# Patient Record
Sex: Female | Born: 1945 | Race: White | Hispanic: No | Marital: Married
Health system: Southern US, Community
[De-identification: ages and names within clinical notes are randomized; demographics above are authoritative.]

## PROBLEM LIST (undated history)

## (undated) DIAGNOSIS — E079 Disorder of thyroid, unspecified: Secondary | ICD-10-CM

## (undated) DIAGNOSIS — E785 Hyperlipidemia, unspecified: Secondary | ICD-10-CM

## (undated) DIAGNOSIS — K219 Gastro-esophageal reflux disease without esophagitis: Secondary | ICD-10-CM

## (undated) HISTORY — PX: BREAST BIOPSY: SHX20

## (undated) HISTORY — PX: BREAST SURGERY: SHX581

## (undated) HISTORY — PX: OTHER SURGICAL HISTORY: SHX169

## (undated) HISTORY — DX: Gastro-esophageal reflux disease without esophagitis: K21.9

## (undated) HISTORY — PX: THYROIDECTOMY, PARTIAL: SHX18

## (undated) HISTORY — PX: ABDOMINAL HYSTERECTOMY: SHX81

## (undated) HISTORY — DX: Hyperlipidemia, unspecified: E78.5

## (undated) HISTORY — DX: Disorder of thyroid, unspecified: E07.9

---

## 2004-10-02 ENCOUNTER — Ambulatory Visit: Payer: Self-pay | Admitting: Unknown Physician Specialty

## 2005-12-16 DIAGNOSIS — K639 Disease of intestine, unspecified: Secondary | ICD-10-CM | POA: Insufficient documentation

## 2005-12-16 DIAGNOSIS — E890 Postprocedural hypothyroidism: Secondary | ICD-10-CM | POA: Insufficient documentation

## 2005-12-16 DIAGNOSIS — K219 Gastro-esophageal reflux disease without esophagitis: Secondary | ICD-10-CM | POA: Insufficient documentation

## 2005-12-23 ENCOUNTER — Ambulatory Visit: Payer: Self-pay | Admitting: Family Medicine

## 2007-01-25 ENCOUNTER — Ambulatory Visit: Payer: Self-pay | Admitting: Family Medicine

## 2007-06-23 DIAGNOSIS — R945 Abnormal results of liver function studies: Secondary | ICD-10-CM | POA: Insufficient documentation

## 2007-06-24 DIAGNOSIS — R7309 Other abnormal glucose: Secondary | ICD-10-CM | POA: Insufficient documentation

## 2008-02-22 ENCOUNTER — Ambulatory Visit: Payer: Self-pay | Admitting: Family Medicine

## 2009-02-25 ENCOUNTER — Ambulatory Visit: Payer: Self-pay | Admitting: Family Medicine

## 2009-09-04 ENCOUNTER — Ambulatory Visit: Payer: Self-pay | Admitting: Gastroenterology

## 2009-09-04 LAB — HM COLONOSCOPY

## 2009-10-02 DIAGNOSIS — E039 Hypothyroidism, unspecified: Secondary | ICD-10-CM | POA: Insufficient documentation

## 2009-10-02 DIAGNOSIS — E559 Vitamin D deficiency, unspecified: Secondary | ICD-10-CM | POA: Insufficient documentation

## 2010-07-20 ENCOUNTER — Ambulatory Visit: Payer: Self-pay | Admitting: Family Medicine

## 2013-09-27 ENCOUNTER — Ambulatory Visit: Payer: Self-pay | Admitting: Family Medicine

## 2014-08-19 DIAGNOSIS — N816 Rectocele: Secondary | ICD-10-CM | POA: Insufficient documentation

## 2014-08-19 DIAGNOSIS — Z1239 Encounter for other screening for malignant neoplasm of breast: Secondary | ICD-10-CM | POA: Insufficient documentation

## 2014-08-19 DIAGNOSIS — N63 Unspecified lump in unspecified breast: Secondary | ICD-10-CM | POA: Insufficient documentation

## 2014-08-19 DIAGNOSIS — Z86010 Personal history of colonic polyps: Secondary | ICD-10-CM | POA: Insufficient documentation

## 2014-08-19 DIAGNOSIS — E7800 Pure hypercholesterolemia, unspecified: Secondary | ICD-10-CM | POA: Insufficient documentation

## 2014-08-27 ENCOUNTER — Ambulatory Visit (INDEPENDENT_AMBULATORY_CARE_PROVIDER_SITE_OTHER): Payer: Medicare PPO | Admitting: Family Medicine

## 2014-08-27 ENCOUNTER — Encounter: Payer: Self-pay | Admitting: Family Medicine

## 2014-08-27 VITALS — BP 110/68 | HR 76 | Temp 98.1°F | Resp 16 | Ht 64.0 in | Wt 135.0 lb

## 2014-08-27 DIAGNOSIS — R945 Abnormal results of liver function studies: Secondary | ICD-10-CM

## 2014-08-27 DIAGNOSIS — E038 Other specified hypothyroidism: Secondary | ICD-10-CM

## 2014-08-27 DIAGNOSIS — R413 Other amnesia: Secondary | ICD-10-CM | POA: Diagnosis not present

## 2014-08-27 DIAGNOSIS — K219 Gastro-esophageal reflux disease without esophagitis: Secondary | ICD-10-CM

## 2014-08-27 DIAGNOSIS — R7989 Other specified abnormal findings of blood chemistry: Secondary | ICD-10-CM | POA: Diagnosis not present

## 2014-08-27 DIAGNOSIS — R7309 Other abnormal glucose: Secondary | ICD-10-CM | POA: Diagnosis not present

## 2014-08-27 MED ORDER — OMEPRAZOLE 20 MG PO CPDR: 20.0000 mg | DELAYED_RELEASE_CAPSULE | Freq: Every day | ORAL | Status: DC

## 2014-08-27 NOTE — Progress Notes (Signed)
Subjective:    Patient ID: [name], female    DOB: 08-15-1945, 69 y.o.   MRN: [id number]  HPI  Memory problem. Patient reports that she has been having memory problems for about 6 months now. Patient reports that her symptoms are worsening. Patient reports that her children have also noted that her memory is worsening.  Does have a very stressful home situation.  Does have 2 granddaughters, a boyfriend and 3 grandchildren living with her right now.  She provides meals, laundry, etc.  One of them works.  One boyfriend in prison, other boyfriend does have a job.   Also has lost almost 10 pounds.       Review of Systems  Constitutional: Positive for fatigue.  Neurological: Negative for dizziness, tremors, speech difficulty, weakness, numbness and headaches.  Psychiatric/Behavioral: Positive for decreased concentration. Negative for hallucinations, behavioral problems, confusion and agitation. The patient is nervous/anxious.     Patient Active Problem List   Diagnosis Date Noted  . Screening breast examination 08/19/2014  . Breast lump 08/19/2014  . Hypercholesteremia 08/19/2014  . History of colon polyps 08/19/2014  . 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 Adult hypothyroidism 10/02/2009  . Avitaminosis D 10/02/2009  . Abnormal blood sugar 06/24/2007  . Abnormal LFTs 06/23/2007  . Acid reflux 12/16/2005  . Bowel disease 12/16/2005  . Hypothyroidism, postop 12/16/2005   Past Medical History  Diagnosis Date  . Thyroid disease   . Hyperlipidemia   . GERD (gastroesophageal reflux disease)    Current Outpatient Prescriptions on File Prior to Visit  Medication Sig  . levothyroxine (SYNTHROID) 100 MCG tablet Take by mouth.  . MULTIPLE VITAMIN PO   . omeprazole (PRILOSEC) 20 MG capsule Take by mouth.  . solifenacin (VESICARE) 10 MG tablet Take by mouth.  Marland Kitchen VITAMIN D, ERGOCALCIFEROL, PO Take 2,000 Int'l Units by mouth daily.    No current  facility-administered medications on file prior to visit.   Not on File Past Surgical History  Procedure Laterality Date  . Thyroidectomy, partial  1990's  . Abdominal hysterectomy      partial  . Breast surgery  2000's    biopsy   History   Social History  . Marital Status: Married    Spouse Name: N/A  . Number of Children: N/A  . Years of Education: N/A   Occupational History  . Not on file.   Social History Main Topics  . Smoking status: Never Smoker   . Smokeless tobacco: Never Used  . Alcohol Use: No  . Drug Use: No  . Sexual Activity: Not on file   Other Topics Concern  . Not on file   Social History Narrative   Family History  Problem Relation Age of Onset  . Alzheimer's disease Mother   . Diabetes Mother     ?  Marland Kitchen Alcohol abuse Father   . Cancer Father     unknown cancer  . Diabetes Other   . Heart disease Other   . Cancer Other   . Colon polyps Other      Cognitive Testing - 6-CIT  Correct? Score   What year is it? yes 0 0 or 4  What month is it? yes 0 0 or 3  Memorize:    [name],  43,  [address]  [place],      What time is it? (within 1 hour) yes 0 0 or 3  Count backwards from 20 yes 0 0, 2,  or 4  Name the months of the year yes 0 0, 2, or 4  Repeat name & address above yes 0 0, 2, 4, 6, 8, or 10       TOTAL SCORE  0/28   Interpretation:  Normal  Normal (0-7) Abnormal (8-28)       Objective:   Physical Exam  Constitutional: She is oriented to person, place, and time. She appears well-developed and well-nourished.  Neurological: She is alert and oriented to person, place, and time.  Psychiatric: She has a normal mood and affect. Her behavior is normal. Judgment and thought content normal.    Blood pressure 110/68, pulse 76, temperature 98.1 F (36.7 C), temperature source Oral, resp. rate 16, height 5\' 4"  (1.626 m), weight 135 lb (61.236 kg).       Assessment & Plan:   1. Abnormal LFTs Will check labs.   - Comprehensive  metabolic panel  2. Abnormal blood sugar Will check labs.  Has lost weight, but is not concerned. Has been eating less with all the stress at home. - Hemoglobin A1c  3. Other specified hypothyroidism Will check labs.  - TSH  4. Memory loss Will check labs and refer to Neurology to evaluate and treat.  - Vitamin B12 - CBC with Differential/Platelet - Ambulatory referral to Neurology  5. Gastroesophageal reflux disease without esophagitis Restart medication as symptoms have returned.   - omeprazole (PRILOSEC) 20 MG capsule; Take 1 capsule (20 mg total) by mouth daily.  Dispense:  90 capsule; Refill: 3  Do suspect some symptoms are related to stress patient is under.    Lorie PhenixNancy Bear Osten, MD

## 2014-08-28 DIAGNOSIS — E038 Other specified hypothyroidism: Secondary | ICD-10-CM | POA: Diagnosis not present

## 2014-08-28 DIAGNOSIS — R413 Other amnesia: Secondary | ICD-10-CM | POA: Diagnosis not present

## 2014-08-28 DIAGNOSIS — R7309 Other abnormal glucose: Secondary | ICD-10-CM | POA: Diagnosis not present

## 2014-08-28 DIAGNOSIS — R7989 Other specified abnormal findings of blood chemistry: Secondary | ICD-10-CM | POA: Diagnosis not present

## 2014-08-29 ENCOUNTER — Telehealth: Payer: Self-pay

## 2014-08-29 LAB — CBC WITH DIFFERENTIAL/PLATELET
Basophils Absolute: 0 10*3/uL (ref 0.0–0.2)
Basos: 0 %
EOS (ABSOLUTE): 0.1 10*3/uL (ref 0.0–0.4)
Eos: 2 %
Hematocrit: 41.6 % (ref 34.0–46.6)
Hemoglobin: 14.2 g/dL (ref 11.1–15.9)
Immature Grans (Abs): 0 10*3/uL (ref 0.0–0.1)
Immature Granulocytes: 0 %
Lymphocytes Absolute: 1.2 10*3/uL (ref 0.7–3.1)
Lymphs: 19 %
MCH: 32 pg (ref 26.6–33.0)
MCHC: 34.1 g/dL (ref 31.5–35.7)
MCV: 94 fL (ref 79–97)
Monocytes Absolute: 0.5 10*3/uL (ref 0.1–0.9)
Monocytes: 9 %
Neutrophils Absolute: 4.4 10*3/uL (ref 1.4–7.0)
Neutrophils: 70 %
Platelets: 173 10*3/uL (ref 150–379)
RBC: 4.44 x10E6/uL (ref 3.77–5.28)
RDW: 13.1 % (ref 12.3–15.4)
WBC: 6.3 10*3/uL (ref 3.4–10.8)

## 2014-08-29 LAB — VITAMIN B12: Vitamin B-12: 321 pg/mL (ref 211–946)

## 2014-08-29 LAB — HEMOGLOBIN A1C
Est. average glucose Bld gHb Est-mCnc: 117 mg/dL
Hgb A1c MFr Bld: 5.7 % — ABNORMAL HIGH (ref 4.8–5.6)

## 2014-08-29 LAB — TSH: TSH: 3.35 u[IU]/mL (ref 0.450–4.500)

## 2014-08-29 LAB — COMPREHENSIVE METABOLIC PANEL
ALT: 12 IU/L (ref 0–32)
AST: 13 IU/L (ref 0–40)
Albumin/Globulin Ratio: 1.8 (ref 1.1–2.5)
Albumin: 4.5 g/dL (ref 3.6–4.8)
Alkaline Phosphatase: 63 IU/L (ref 39–117)
BUN/Creatinine Ratio: 13 (ref 11–26)
BUN: 10 mg/dL (ref 8–27)
Bilirubin Total: 0.4 mg/dL (ref 0.0–1.2)
CO2: 24 mmol/L (ref 18–29)
Calcium: 9.3 mg/dL (ref 8.7–10.3)
Chloride: 104 mmol/L (ref 97–108)
Creatinine, Ser: 0.79 mg/dL (ref 0.57–1.00)
GFR calc Af Amer: 89 mL/min/{1.73_m2} (ref >59)
GFR calc non Af Amer: 77 mL/min/{1.73_m2} (ref >59)
Globulin, Total: 2.5 g/dL (ref 1.5–4.5)
Glucose: 107 mg/dL — ABNORMAL HIGH (ref 65–99)
Potassium: 4.2 mmol/L (ref 3.5–5.2)
Sodium: 142 mmol/L (ref 134–144)
Total Protein: 7 g/dL (ref 6.0–8.5)

## 2014-08-29 NOTE — Telephone Encounter (Signed)
-----   Message from Lorie PhenixNancy Maloney, MD sent at 08/29/2014 10:52 AM EDT ----- Labs ok. Vit B12 low normal. Would recommend take 1000 mcg otc daily and proceed with neurology referral. Thanks.

## 2014-08-29 NOTE — Telephone Encounter (Signed)
LMTCB 08/29/2014  Thanks,   -Laura  

## 2014-08-30 NOTE — Telephone Encounter (Signed)
LMTCB 08/30/2014  Thanks,   -Zachary Nole  

## 2014-09-02 NOTE — Telephone Encounter (Signed)
Patient advised as below.  

## 2014-09-03 ENCOUNTER — Other Ambulatory Visit: Payer: Self-pay | Admitting: Family Medicine

## 2014-09-03 DIAGNOSIS — E890 Postprocedural hypothyroidism: Secondary | ICD-10-CM

## 2014-09-03 NOTE — Telephone Encounter (Signed)
Pt called needing refill on her thyroid medication.  She uses [facility] in [place].   She is completely out.  Her call back is [phone].

## 2014-10-04 DIAGNOSIS — G3184 Mild cognitive impairment, so stated: Secondary | ICD-10-CM | POA: Diagnosis not present

## 2014-11-21 ENCOUNTER — Ambulatory Visit (INDEPENDENT_AMBULATORY_CARE_PROVIDER_SITE_OTHER): Payer: Medicare PPO | Admitting: Family Medicine

## 2014-11-21 ENCOUNTER — Encounter: Payer: Self-pay | Admitting: Family Medicine

## 2014-11-21 VITALS — BP 138/82 | HR 64 | Temp 97.8°F | Resp 16 | Ht 63.75 in | Wt 131.0 lb

## 2014-11-21 DIAGNOSIS — R634 Abnormal weight loss: Secondary | ICD-10-CM | POA: Diagnosis not present

## 2014-11-21 DIAGNOSIS — Z0000 Encounter for general adult medical examination without abnormal findings: Secondary | ICD-10-CM | POA: Diagnosis not present

## 2014-11-21 DIAGNOSIS — E038 Other specified hypothyroidism: Secondary | ICD-10-CM

## 2014-11-21 DIAGNOSIS — Z13820 Encounter for screening for osteoporosis: Secondary | ICD-10-CM | POA: Diagnosis not present

## 2014-11-21 DIAGNOSIS — Z1231 Encounter for screening mammogram for malignant neoplasm of breast: Secondary | ICD-10-CM

## 2014-11-21 DIAGNOSIS — Z23 Encounter for immunization: Secondary | ICD-10-CM | POA: Diagnosis not present

## 2014-11-21 NOTE — Progress Notes (Signed)
Patient: Katelyn Baker, Female    DOB: May 30, 1945, 69 y.o.   MRN: [id number] Visit Date: 11/21/2014  Today's Provider: Lorie Phenix, MD   Chief Complaint  Patient presents with  . Medicare Wellness  . Hypothyroidism   Subjective:    Annual wellness visit Katelyn Baker is a 69 y.o. female. She feels well. She reports exercising none due to grandchildren living with her. She reports she is sleeping well.  Last CPE- 09/21/2013 Last Pap- 2009 WNL; S/P hysterectomy Last Mammo- 10/07/2013- BI-RADS 2 Last Colonoscopy- 09/04/2009. Dr. Niel Baker. Diverticulosis. Otherwise WNL. Last BMD- 07/22/2010. WNL Tdap- 12/16/2005 Pneumovax- 12/02/2011 Prevnar- 09/30/2013 Zoster- 09/30/2013 -----------------------------------------------------------  Hypothyroidism: Patient presents for evaluation of thyroid function. Symptoms consist of fatigue, weight loss. Symptoms have present for several years. The symptoms are mild.  The problem has been unchanged.  Previous thyroid studies include TSH. The hypothyroidism is due to thyroidectomy.  Patient also with continued weight loss.  Has really lost a lot of weight over past years. Thinks related to stress and responsibility. Also has memory loss. No other symptoms like fever, night sweats, cough or bowel changes.     Review of Systems  Constitutional: Positive for fatigue and unexpected weight change (went from size to 16 to size 9 over the course of 1 year). Negative for fever, chills, diaphoresis, activity change and appetite change.  HENT: Negative.   Eyes: Negative.   Respiratory: Negative.   Cardiovascular: Negative.   Gastrointestinal: Negative.   Endocrine: Negative.   Genitourinary: Negative.   Musculoskeletal: Negative.   Skin: Negative.   Allergic/Immunologic: Negative.   Neurological: Negative.   Hematological: Negative.   Psychiatric/Behavioral: Negative.     Social History   Social History  . Marital Status: Married     Spouse Name: Katelyn Baker  . Number of Children: 2  . Years of Education: 13   Occupational History  . Retired    Social History Main Topics  . Smoking status: Never Smoker   . Smokeless tobacco: Never Used  . Alcohol Use: No  . Drug Use: No  . Sexual Activity: Not on file   Other Topics Concern  . Not on file   Social History Narrative    Patient Active Problem List   Diagnosis Date Noted  . Memory loss 08/27/2014  . Screening breast examination 08/19/2014  . Breast lump 08/19/2014  . Hypercholesteremia 08/19/2014  . History of colon polyps 08/19/2014  . 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 Adult hypothyroidism 10/02/2009  . Avitaminosis D 10/02/2009  . Abnormal blood sugar 06/24/2007  . Abnormal LFTs 06/23/2007  . Acid reflux 12/16/2005  . Bowel disease 12/16/2005  . Hypothyroidism, postop 12/16/2005    Past Surgical History  Procedure Laterality Date  . Thyroidectomy, partial  1990's  . Abdominal hysterectomy      partial  . Breast surgery  2000's    biopsy    Her family history includes Alcohol abuse in her father; Alzheimer's disease in her mother; Cancer in her father and other; Colon polyps in her other; Diabetes in her mother and other; Heart disease in her other.    Previous Medications   CALCIUM CARBONATE (CALCIUM 600 PO)    Take by mouth.   CYANOCOBALAMIN (RA VITAMIN B-12 TR) 1000 MCG TBCR    Take by mouth.   LEVOTHYROXINE (SYNTHROID, LEVOTHROID) 100 MCG TABLET    TAKE ONE TABLET BY MOUTH ONCE DAILY   OMEPRAZOLE (PRILOSEC) 20 MG CAPSULE  Take 1 capsule (20 mg total) by mouth daily.   SOLIFENACIN (VESICARE) 10 MG TABLET    Take by mouth.   VITAMIN D, ERGOCALCIFEROL, PO    Take 2,000 Int'l Units by mouth daily.     Patient Care Team: Katelyn Phenix, MD as PCP - General (Family Medicine)     Objective:   Vitals: BP 138/82 mmHg  Pulse 64  Temp(Src) 97.8 F (36.6 C) (Oral)  Resp 16  Ht 5' 3.75" (1.619 m)  Wt 131 lb (59.421  kg)  BMI 22.67 kg/m2  Physical Exam  Constitutional: She is oriented to person, place, and time. She appears well-developed and well-nourished.  HENT:  Head: Normocephalic and atraumatic.  Right Ear: Tympanic membrane, external ear and ear canal normal.  Left Ear: Tympanic membrane, external ear and ear canal normal.  Nose: Nose normal.  Mouth/Throat: Uvula is midline, oropharynx is clear and moist and mucous membranes are normal.  Eyes: Conjunctivae, EOM and lids are normal. Pupils are equal, round, and reactive to light.  Neck: Trachea normal and normal range of motion. Neck supple. Carotid bruit is not present. No thyroid mass and no thyromegaly present.  Cardiovascular: Normal rate, regular rhythm and normal heart sounds.   Pulmonary/Chest: Effort normal and breath sounds normal.  Abdominal: Soft. Normal appearance and bowel sounds are normal. There is no hepatosplenomegaly. There is no tenderness.  Genitourinary: No breast swelling, tenderness or discharge.  Musculoskeletal: Normal range of motion.  Lymphadenopathy:    She has no cervical adenopathy.    She has no axillary adenopathy.  Neurological: She is alert and oriented to person, place, and time. She has normal strength. No cranial nerve deficit.  Skin: Skin is warm, dry and intact.  Psychiatric: She has a normal mood and affect. Her speech is normal and behavior is normal. Judgment and thought content normal. Cognition and memory are normal.    Activities of Daily Living In your present state of health, do you have any difficulty performing the following activities: 11/21/2014 08/27/2014  Hearing? N N  Vision? N N  Difficulty concentrating or making decisions? N Y  Walking or climbing stairs? N N  Dressing or bathing? N N  Doing errands, shopping? N N    Fall Risk Assessment Fall Risk  11/21/2014 08/27/2014  Falls in the past year? No No     Depression Screen PHQ 2/9 Scores 11/21/2014 08/27/2014  PHQ - 2 Score 0 0     Cognitive Testing - 6-CIT  Correct? Score   What year is it? yes 0 0 or 4  What month is it? yes 0 0 or 3  Memorize:    Katelyn Baker,  43,  High [address],  [place],      What time is it? (within 1 hour) yes 0 0 or 3  Count backwards from 20 yes 0 0, 2, or 4  Name the months of the year yes 0 0, 2, or 4  Repeat name & address above no 4 0, 2, 4, 6, 8, or 10       TOTAL SCORE  4/28   Interpretation:  Normal  Normal (0-7) Abnormal (8-28)       Assessment & Plan:     Annual Wellness Visit  Reviewed patient's Family Medical History Reviewed and updated list of patient's medical providers Assessment of cognitive impairment was done Assessed patient's functional ability Established a written schedule for health screening services Health Risk Assessent Completed and Reviewed  Exercise Activities  and Dietary recommendations Goals    None      Immunization History  Administered Date(s) Administered  . Pneumococcal Conjugate-13 09/30/2013  . Pneumococcal Polysaccharide-23 12/02/2011  . Tdap 12/16/2005  . Zoster 09/30/2013    Health Maintenance  Topic Date Due  . Hepatitis C Screening  1945/06/11  . MAMMOGRAM  06/13/1996  . COLONOSCOPY  06/13/1996  . ZOSTAVAX  06/14/2006  . DEXA SCAN  06/14/2011  . PNA vac Low Risk Adult (2 of 2 - PCV13) 12/01/2012  . INFLUENZA VACCINE  09/23/2014  . TETANUS/TDAP  12/17/2015      Discussed health benefits of physical activity, and encouraged her to engage in regular exercise appropriate for her age and condition.   1. Encounter for screening mammogram for breast cancer - MM DIGITAL SCREENING BILATERAL; Future  2. Flu vaccine need - Flu vaccine HIGH DOSE PF  3. Abnormal weight loss Patient has continued to loose weight. Believes related to continued stress. Also thinks this is reason for her mild cognitive impairment.  Will start work up however, as is concerning amount of weight loss.  Did have normal labs in July.  Reviewed  records from neurology.   - DG Chest 2 View; Future - CT Abdomen Pelvis W Contrast; Future  4. Medicare annual wellness visit, subsequent As above.   5. Other specified hypothyroidism Stable. Labs reviewed. Continue current medication.     6. Screening for osteoporosis - DG Bone Density; Future   Patient was seen and examined by Leo Grosser, MD, and note scribed by Allene Dillon, CMA.  I have reviewed the document for accuracy and completeness and I agree with above. Leo Grosser, MD   Katelyn Phenix, MD   ------------------------------------------------------------------------------------------------------------

## 2014-11-28 ENCOUNTER — Ambulatory Visit: Admission: RE | Admit: 2014-11-28 | Payer: Medicare PPO | Source: Ambulatory Visit

## 2014-12-03 ENCOUNTER — Ambulatory Visit
Admission: RE | Admit: 2014-12-03 | Discharge: 2014-12-03 | Disposition: A | Discharge status: Home / Self Care | Payer: Medicare PPO | Source: Ambulatory Visit | Attending: Family Medicine | Admitting: Family Medicine

## 2014-12-03 ENCOUNTER — Other Ambulatory Visit: Payer: Self-pay | Admitting: Family Medicine

## 2014-12-03 DIAGNOSIS — Z1231 Encounter for screening mammogram for malignant neoplasm of breast: Secondary | ICD-10-CM

## 2014-12-03 DIAGNOSIS — N63 Unspecified lump in breast: Secondary | ICD-10-CM | POA: Insufficient documentation

## 2014-12-03 DIAGNOSIS — R634 Abnormal weight loss: Secondary | ICD-10-CM | POA: Insufficient documentation

## 2014-12-03 DIAGNOSIS — K7689 Other specified diseases of liver: Secondary | ICD-10-CM | POA: Insufficient documentation

## 2014-12-03 DIAGNOSIS — K5900 Constipation, unspecified: Secondary | ICD-10-CM | POA: Insufficient documentation

## 2014-12-03 DIAGNOSIS — Z13820 Encounter for screening for osteoporosis: Secondary | ICD-10-CM

## 2014-12-03 DIAGNOSIS — N281 Cyst of kidney, acquired: Secondary | ICD-10-CM | POA: Insufficient documentation

## 2014-12-03 MED ORDER — IOHEXOL 300 MG/ML  SOLN
100.0000 mL | Freq: Once | INTRAMUSCULAR | Status: AC | PRN
  Administered 2014-12-03: 100 mL via INTRAVENOUS

## 2014-12-05 ENCOUNTER — Telehealth: Payer: Self-pay

## 2014-12-05 NOTE — Telephone Encounter (Signed)
-----   Message from Lorie PhenixNancy Maloney, MD sent at 12/03/2014  2:57 PM EDT ----- CT with no acute findings. Is lesion in left lateral breast, but looks like hs normal mammogram also. CXR normal.  Follow up if has continued weight loss. Thanks.

## 2014-12-05 NOTE — Telephone Encounter (Signed)
LMTCB 12/05/2014  Thanks,  -Laura  

## 2014-12-10 NOTE — Telephone Encounter (Signed)
Pt states she will call back tomorrow for the results.  She is celebrating her mother's 90th Birthday today.  Thanks,   -[name]

## 2014-12-16 ENCOUNTER — Ambulatory Visit
Admission: RE | Admit: 2014-12-16 | Discharge: 2014-12-16 | Disposition: A | Discharge status: Home / Self Care | Payer: Medicare PPO | Source: Ambulatory Visit | Attending: Family Medicine | Admitting: Family Medicine

## 2014-12-16 DIAGNOSIS — Z13820 Encounter for screening for osteoporosis: Secondary | ICD-10-CM | POA: Diagnosis not present

## 2014-12-16 DIAGNOSIS — Z780 Asymptomatic menopausal state: Secondary | ICD-10-CM | POA: Diagnosis not present

## 2014-12-16 NOTE — Telephone Encounter (Signed)
[id number] 

## 2014-12-17 ENCOUNTER — Other Ambulatory Visit: Payer: Self-pay | Admitting: Family Medicine

## 2014-12-17 DIAGNOSIS — N3941 Urge incontinence: Secondary | ICD-10-CM | POA: Insufficient documentation

## 2014-12-17 DIAGNOSIS — E890 Postprocedural hypothyroidism: Secondary | ICD-10-CM

## 2014-12-17 MED ORDER — SOLIFENACIN SUCCINATE 10 MG PO TABS: 10.0000 mg | ORAL_TABLET | Freq: Every day | ORAL | Status: DC

## 2014-12-17 MED ORDER — LEVOTHYROXINE SODIUM 100 MCG PO TABS: 100.0000 ug | ORAL_TABLET | Freq: Every day | ORAL | Status: DC

## 2014-12-17 NOTE — Telephone Encounter (Signed)
Refilled as below. Allene DillonEmily Drozdowski, CMA

## 2014-12-17 NOTE — Telephone Encounter (Signed)
Pt contacted office for refill request on the following medications: [facility] [place].  CB#[phone]/MW   levothyroxine (SYNTHROID, LEVOTHROID) 100 MCG   solifenacin (VESICARE) 10 MG

## 2014-12-17 NOTE — Telephone Encounter (Signed)
Ok to refill. Thanks 

## 2014-12-18 ENCOUNTER — Telehealth: Payer: Self-pay

## 2014-12-18 NOTE — Telephone Encounter (Signed)
-----   Message from Lorie PhenixNancy Maloney, MD sent at 12/17/2014  3:58 PM EDT ----- No osteoporosis. Please notify patient. Thanks.

## 2014-12-18 NOTE — Telephone Encounter (Signed)
LMTCB 12/18/2014  Thanks,   -Daniyla Pfahler  

## 2014-12-24 NOTE — Telephone Encounter (Signed)
LMTCB. sd  

## 2014-12-24 NOTE — Telephone Encounter (Signed)
Informed pt as below. Aero Drummonds Drozdowski, CMA  

## 2015-01-07 DIAGNOSIS — Z872 Personal history of diseases of the skin and subcutaneous tissue: Secondary | ICD-10-CM | POA: Diagnosis not present

## 2015-01-07 DIAGNOSIS — L728 Other follicular cysts of the skin and subcutaneous tissue: Secondary | ICD-10-CM | POA: Diagnosis not present

## 2015-01-07 DIAGNOSIS — D224 Melanocytic nevi of scalp and neck: Secondary | ICD-10-CM | POA: Diagnosis not present

## 2015-01-07 DIAGNOSIS — Z1283 Encounter for screening for malignant neoplasm of skin: Secondary | ICD-10-CM | POA: Diagnosis not present

## 2015-01-07 DIAGNOSIS — D485 Neoplasm of uncertain behavior of skin: Secondary | ICD-10-CM | POA: Diagnosis not present

## 2015-01-07 DIAGNOSIS — L570 Actinic keratosis: Secondary | ICD-10-CM | POA: Diagnosis not present

## 2015-01-07 DIAGNOSIS — L118 Other specified acantholytic disorders: Secondary | ICD-10-CM | POA: Diagnosis not present

## 2015-01-07 DIAGNOSIS — L910 Hypertrophic scar: Secondary | ICD-10-CM | POA: Diagnosis not present

## 2015-04-29 ENCOUNTER — Ambulatory Visit (INDEPENDENT_AMBULATORY_CARE_PROVIDER_SITE_OTHER): Payer: Medicare Other | Admitting: Family Medicine

## 2015-04-29 ENCOUNTER — Encounter: Payer: Self-pay | Admitting: Family Medicine

## 2015-04-29 VITALS — BP 140/82 | HR 64 | Temp 97.9°F | Resp 16 | Ht 64.0 in | Wt 129.0 lb

## 2015-04-29 DIAGNOSIS — E7800 Pure hypercholesterolemia, unspecified: Secondary | ICD-10-CM

## 2015-04-29 DIAGNOSIS — R7309 Other abnormal glucose: Secondary | ICD-10-CM

## 2015-04-29 DIAGNOSIS — R634 Abnormal weight loss: Secondary | ICD-10-CM | POA: Diagnosis not present

## 2015-04-29 DIAGNOSIS — R7989 Other specified abnormal findings of blood chemistry: Secondary | ICD-10-CM

## 2015-04-29 DIAGNOSIS — E039 Hypothyroidism, unspecified: Secondary | ICD-10-CM | POA: Diagnosis not present

## 2015-04-29 DIAGNOSIS — Z0000 Encounter for general adult medical examination without abnormal findings: Secondary | ICD-10-CM

## 2015-04-29 DIAGNOSIS — R945 Abnormal results of liver function studies: Secondary | ICD-10-CM

## 2015-04-29 LAB — POCT URINALYSIS DIPSTICK
Bilirubin, UA: NEGATIVE
Blood, UA: NEGATIVE
Glucose, UA: NEGATIVE
Ketones, UA: NEGATIVE
Leukocytes, UA: NEGATIVE
Nitrite, UA: NEGATIVE
Protein, UA: NEGATIVE
Spec Grav, UA: 1.01
Urobilinogen, UA: 0.2
pH, UA: 7.5

## 2015-04-29 NOTE — Progress Notes (Signed)
Patient ID: Katelyn Baker, female   DOB: October 03, 1945, 70 y.o.   MRN: [id number]       Patient: Katelyn Baker, Female    DOB: [birth year], 70 y.o.   MRN: [id number] Visit Date: 04/29/2015  Today's Provider: Lorie Phenix, MD   Chief Complaint  Patient presents with  . Medicare Wellness   Subjective:    Annual wellness visit MYCA PERNO is a 70 y.o. female. She feels well. She reports exercising stays active with daily activities. She reports she is sleeping well. 09/21/13 CPE 12/03/14 Mammogram-BI-RADS 1 09/04/09 Colonoscopy-diverticulosis, dr Niel Hummer 12/16/14 BMD-osteopenia  Lab Results  Component Value Date   WBC 6.3 08/28/2014   HCT 41.6 08/28/2014   PLT 173 08/28/2014   GLUCOSE 107* 08/28/2014   ALT 12 08/28/2014   AST 13 08/28/2014   NA 142 08/28/2014   K 4.2 08/28/2014   CL 104 08/28/2014   CREATININE 0.79 08/28/2014   BUN 10 08/28/2014   CO2 24 08/28/2014   TSH 3.350 08/28/2014   HGBA1C 5.7* 08/28/2014   -----------------------------------------------------------  Abnormal weight loss: Patient has been losing weight with out trying. Patient reports that she has been very active at home with her three great grandchildren that she takes care of daily. Patient denies cough or shortness of breaths. Patient reports she is not concerned at this time about her weight loss. Patient reports that her family is not concerned about weight loss either.  Wt Readings from Last 3 Encounters:  04/29/15 129 lb (58.514 kg)  11/21/14 131 lb (59.421 kg)  08/27/14 135 lb (61.236 kg)      Review of Systems  Constitutional: Positive for activity change and unexpected weight change.  HENT: Positive for ear discharge.   Eyes: Negative.   Respiratory: Negative.   Cardiovascular: Negative.   Gastrointestinal: Negative.   Endocrine: Negative.   Genitourinary: Negative.   Musculoskeletal: Negative.   Skin: Negative.   Allergic/Immunologic: Negative.   Neurological:  Negative.   Hematological: Negative.   Psychiatric/Behavioral: Negative.     Social History   Social History  . Marital Status: Married    Spouse Name: Jonny Ruiz  . Number of Children: 2  . Years of Education: 13   Occupational History  . Retired    Social History Main Topics  . Smoking status: Never Smoker   . Smokeless tobacco: Never Used  . Alcohol Use: No  . Drug Use: No  . Sexual Activity: Not on file   Other Topics Concern  . Not on file   Social History Narrative    Past Medical History  Diagnosis Date  . Thyroid disease   . Hyperlipidemia   . GERD (gastroesophageal reflux disease)      Patient Active Problem List   Diagnosis Date Noted  . Urge incontinence of urine 12/17/2014  . Memory loss 08/27/2014  . Screening breast examination 08/19/2014  . Breast lump 08/19/2014  . Hypercholesteremia 08/19/2014  . History of colon polyps 08/19/2014  . 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 Adult hypothyroidism 10/02/2009  . Avitaminosis D 10/02/2009  . Abnormal blood sugar 06/24/2007  . Abnormal LFTs 06/23/2007  . Acid reflux 12/16/2005  . Bowel disease 12/16/2005  . Hypothyroidism, postop 12/16/2005    Past Surgical History  Procedure Laterality Date  . Thyroidectomy, partial  1990's  . Abdominal hysterectomy      partial  . Breast surgery  2000's    biopsy  . Breast biopsy Left 1980'S  EXCISIONAL - NEG    Her family history includes Alcohol abuse in her father; Alzheimer's disease in her mother; Cancer in her father and other; Colon polyps in her other; Diabetes in her mother and other; Heart disease in her other. There is no history of Breast cancer.    Previous Medications   CALCIUM CARBONATE (CALCIUM 600 PO)    Take by mouth.   CYANOCOBALAMIN (RA VITAMIN B-12 TR) 1000 MCG TBCR    Take by mouth.   LEVOTHYROXINE (SYNTHROID, LEVOTHROID) 100 MCG TABLET    Take 1 tablet (100 mcg total) by mouth daily.   SOLIFENACIN (VESICARE) 10 MG  TABLET    Take 1 tablet (10 mg total) by mouth daily.   VITAMIN D, ERGOCALCIFEROL, PO    Take 2,000 Int'l Units by mouth daily.     Patient Care Team: Lorie PhenixNancy Rahshawn Remo, MD as PCP - General (Family Medicine)     Objective:   Vitals: BP 140/82 mmHg  Pulse 64  Temp(Src) 97.9 F (36.6 C) (Oral)  Resp 16  Ht 5\' 4"  (1.626 m)  Wt 129 lb (58.514 kg)  BMI 22.13 kg/m2  Physical Exam  Constitutional: She is oriented to person, place, and time. She appears well-developed and well-nourished.  HENT:  Head: Normocephalic and atraumatic.  Right Ear: Tympanic membrane, external ear and ear canal normal.  Left Ear: Tympanic membrane, external ear and ear canal normal.  Nose: Nose normal.  Mouth/Throat: Uvula is midline, oropharynx is clear and moist and mucous membranes are normal.  Eyes: Conjunctivae, EOM and lids are normal. Pupils are equal, round, and reactive to light.  Neck: Trachea normal and normal range of motion. Neck supple. Carotid bruit is not present. No thyroid mass and no thyromegaly present.  Cardiovascular: Normal rate, regular rhythm and normal heart sounds.   Pulmonary/Chest: Effort normal and breath sounds normal.  Abdominal: Soft. Normal appearance and bowel sounds are normal. There is no hepatosplenomegaly. There is no tenderness.  Musculoskeletal: Normal range of motion.  Lymphadenopathy:    She has no cervical adenopathy.    She has no axillary adenopathy.  Neurological: She is alert and oriented to person, place, and time. She has normal strength. No cranial nerve deficit.  Skin: Skin is warm, dry and intact.  Psychiatric: She has a normal mood and affect. Her speech is normal and behavior is normal. Judgment and thought content normal. Cognition and memory are normal.    Activities of Daily Living In your present state of health, do you have any difficulty performing the following activities: 04/29/2015 11/21/2014  Hearing? N N  Vision? N N  Difficulty concentrating or  making decisions? Y N  Walking or climbing stairs? N N  Dressing or bathing? N N  Doing errands, shopping? N N    Fall Risk Assessment Fall Risk  04/29/2015 11/21/2014 08/27/2014  Falls in the past year? No No No     Depression Screen PHQ 2/9 Scores 04/29/2015 11/21/2014 08/27/2014  PHQ - 2 Score 0 0 0    Cognitive Testing - 6-CIT  Correct? Score   What year is it? yes 0 0 or 4  What month is it? yes 0 0 or 3  Memorize:    Floyde ParkinsJohn,  [place],  43,  [address],  [place],      What time is it? (within 1 hour) yes 0 0 or 3  Count backwards from 20 yes 0 0, 2, or 4  Name the months of the year yes 0  0, 2, or 4  Repeat name & address above no 4 0, 2, 4, 6, 8, or 10       TOTAL SCORE  4/28   Interpretation:  Normal  Normal (0-7) Abnormal (8-28)       Assessment & Plan:     Annual Wellness Visit  Reviewed patient's Family Medical History Reviewed and updated list of patient's medical providers Assessment of cognitive impairment was done Assessed patient's functional ability Established a written schedule for health screening services Health Risk Assessent Completed and Reviewed  Exercise Activities and Dietary recommendations Goals    None      Immunization History  Administered Date(s) Administered  . Influenza, High Dose Seasonal PF 11/21/2014  . Pneumococcal Conjugate-13 09/30/2013  . Pneumococcal Polysaccharide-23 12/02/2011  . Tdap 12/16/2005  . Zoster 09/30/2013          1. Medicare annual wellness visit, subsequent Stable. Patient advised to continue eating healthy and exercise daily. - POCT urinalysis dipstick  2. Abnormal weight loss Patient advised to continue monitoring weight. F/U pending lab report.  Did also talk with her daughter who confirms she is not eating well. She is going to talk to her dad and get back to me if wants further work up.   3. Hypothyroidism, unspecified hypothyroidism type - TSH  4. Abnormal blood sugar - Hemoglobin A1c  5.  Abnormal LFTs - Comprehensive metabolic panel  6. Hypercholesteremia - CBC with Differential/Platelet - Lipid Panel With LDL/HDL Ratio     Patient seen and examined by Dr. Leo Grosser, and note scribed by Liz Beach. Dimas, CMA.  I have reviewed the document for accuracy and completeness and I agree with above. Leo Grosser, MD   Lorie Phenix, MD    ------------------------------------------------------------------------------------------------------------

## 2015-05-01 ENCOUNTER — Telehealth: Payer: Self-pay

## 2015-05-01 LAB — CBC WITH DIFFERENTIAL/PLATELET
Basophils Absolute: 0 10*3/uL (ref 0.0–0.2)
Basos: 0 %
EOS (ABSOLUTE): 0.1 10*3/uL (ref 0.0–0.4)
Eos: 1 %
Hematocrit: 42.2 % (ref 34.0–46.6)
Hemoglobin: 14 g/dL (ref 11.1–15.9)
Immature Grans (Abs): 0 10*3/uL (ref 0.0–0.1)
Immature Granulocytes: 0 %
Lymphocytes Absolute: 0.9 10*3/uL (ref 0.7–3.1)
Lymphs: 16 %
MCH: 30.4 pg (ref 26.6–33.0)
MCHC: 33.2 g/dL (ref 31.5–35.7)
MCV: 92 fL (ref 79–97)
Monocytes Absolute: 0.4 10*3/uL (ref 0.1–0.9)
Monocytes: 7 %
Neutrophils Absolute: 4.2 10*3/uL (ref 1.4–7.0)
Neutrophils: 76 %
Platelets: 160 10*3/uL (ref 150–379)
RBC: 4.6 x10E6/uL (ref 3.77–5.28)
RDW: 13.2 % (ref 12.3–15.4)
WBC: 5.6 10*3/uL (ref 3.4–10.8)

## 2015-05-01 LAB — COMPREHENSIVE METABOLIC PANEL
ALT: 12 IU/L (ref 0–32)
AST: 15 IU/L (ref 0–40)
Albumin/Globulin Ratio: 1.8 (ref 1.1–2.5)
Albumin: 4.2 g/dL (ref 3.6–4.8)
Alkaline Phosphatase: 55 IU/L (ref 39–117)
BUN/Creatinine Ratio: 13 (ref 11–26)
BUN: 10 mg/dL (ref 8–27)
Bilirubin Total: 0.5 mg/dL (ref 0.0–1.2)
CO2: 26 mmol/L (ref 18–29)
Calcium: 9.3 mg/dL (ref 8.7–10.3)
Chloride: 102 mmol/L (ref 96–106)
Creatinine, Ser: 0.8 mg/dL (ref 0.57–1.00)
GFR calc Af Amer: 88 mL/min/{1.73_m2} (ref >59)
GFR calc non Af Amer: 76 mL/min/{1.73_m2} (ref >59)
Globulin, Total: 2.4 g/dL (ref 1.5–4.5)
Glucose: 86 mg/dL (ref 65–99)
Potassium: 4.3 mmol/L (ref 3.5–5.2)
Sodium: 143 mmol/L (ref 134–144)
Total Protein: 6.6 g/dL (ref 6.0–8.5)

## 2015-05-01 LAB — LIPID PANEL WITH LDL/HDL RATIO
Cholesterol, Total: 187 mg/dL (ref 100–199)
HDL: 53 mg/dL (ref >39)
LDL Calculated: 115 mg/dL — ABNORMAL HIGH (ref 0–99)
LDl/HDL Ratio: 2.2 ratio units (ref 0.0–3.2)
Triglycerides: 97 mg/dL (ref 0–149)
VLDL Cholesterol Cal: 19 mg/dL (ref 5–40)

## 2015-05-01 LAB — HEMOGLOBIN A1C
Est. average glucose Bld gHb Est-mCnc: 111 mg/dL
Hgb A1c MFr Bld: 5.5 % (ref 4.8–5.6)

## 2015-05-01 LAB — TSH: TSH: 3.18 u[IU]/mL (ref 0.450–4.500)

## 2015-05-01 NOTE — Telephone Encounter (Signed)
Left message to call back  

## 2015-05-01 NOTE — Telephone Encounter (Signed)
-----   Message from Lorie PhenixNancy Maloney, MD sent at 05/01/2015  7:47 AM EST ----- Labs stable. Please notify patient. Thanks.

## 2015-05-01 NOTE — Telephone Encounter (Signed)
Pt is returning call.  JX#[phone]/ZHCB#[phone]/MW

## 2015-05-01 NOTE — Telephone Encounter (Signed)
Pt advised.   Thanks,   -Katelyn Baker  

## 2015-08-13 ENCOUNTER — Other Ambulatory Visit: Payer: Self-pay | Admitting: Family Medicine

## 2015-08-13 DIAGNOSIS — N3941 Urge incontinence: Secondary | ICD-10-CM

## 2015-09-01 ENCOUNTER — Telehealth: Payer: Self-pay | Admitting: Family Medicine

## 2015-09-01 DIAGNOSIS — Z1239 Encounter for other screening for malignant neoplasm of breast: Secondary | ICD-10-CM

## 2015-09-01 NOTE — Telephone Encounter (Signed)
Pt states she was told to call and ask for an order to be put in for her to have a mammogram. Please call pt for information on what to do from here forward.  Pt states to call her house phone first and then cell phone @ [phone].  Thanks CC

## 2015-09-02 NOTE — Telephone Encounter (Signed)
Patient advised as directed below.  Thanks,  -Joseline 

## 2015-09-02 NOTE — Telephone Encounter (Signed)
Mammogram order placed since she was seen in 04/2015 for CPE. Now she may call [facility] to schedule at her convenience.

## 2015-09-03 ENCOUNTER — Ambulatory Visit (INDEPENDENT_AMBULATORY_CARE_PROVIDER_SITE_OTHER): Payer: Medicare Other | Admitting: Physician Assistant

## 2015-09-03 ENCOUNTER — Encounter: Payer: Self-pay | Admitting: Physician Assistant

## 2015-09-03 VITALS — BP 120/60 | HR 60 | Temp 97.5°F | Resp 16 | Wt 126.0 lb

## 2015-09-03 DIAGNOSIS — N63 Unspecified lump in breast: Secondary | ICD-10-CM | POA: Diagnosis not present

## 2015-09-03 DIAGNOSIS — N6320 Unspecified lump in the left breast, unspecified quadrant: Secondary | ICD-10-CM

## 2015-09-03 NOTE — Progress Notes (Signed)
       Patient: Katelyn Baker Female    DOB: December 14, 1945   70 y.o.   MRN: [id number] Visit Date: 09/03/2015  Today's Provider: Margaretann LovelessJennifer M Burnette, PA-C   Chief Complaint  Patient presents with  . Breast Mass   Subjective:    HPI Patient is here with c/o of lump on left breast. She reports that it is not tender. She denies any skin changes, asymmetry, redness or nipple discharge. She has an order placed for screening mammogram. She does have a family history of breast cancer in her sister. She had a lumpectomy when she was in her 30s. No issues or recurrence since.     No Known Allergies Current Meds  Medication Sig  . Calcium Carbonate (CALCIUM 600 PO) Take by mouth.  . Cyanocobalamin (RA VITAMIN B-12 TR) 1000 MCG TBCR Take by mouth.  . levothyroxine (SYNTHROID, LEVOTHROID) 100 MCG tablet Take 1 tablet (100 mcg total) by mouth daily.  . VESICARE 10 MG tablet TAKE ONE TABLET BY MOUTH ONCE DAILY  . VITAMIN D, ERGOCALCIFEROL, PO Take 2,000 Int'l Units by mouth daily.     Review of Systems  Constitutional: Positive for unexpected weight change (went from size 16 to 9). Negative for fatigue.  Respiratory: Negative.   Cardiovascular: Negative for chest pain, palpitations and leg swelling.       Breast mass  Gastrointestinal: Negative.   Musculoskeletal: Negative.   Psychiatric/Behavioral: Negative.     Social History  Substance Use Topics  . Smoking status: Never Smoker   . Smokeless tobacco: Never Used  . Alcohol Use: No   Objective:   BP 120/60 mmHg  Pulse 60  Temp(Src) 97.5 F (36.4 C) (Oral)  Resp 16  Wt 126 lb (57.153 kg)  Physical Exam  Constitutional: She appears well-developed and well-nourished. No distress.  Neck: Normal range of motion. Neck supple. No tracheal deviation present. No thyromegaly present.  Cardiovascular: Normal rate, regular rhythm and normal heart sounds.  Exam reveals no gallop and no friction rub.   No murmur heard. Pulmonary/Chest:  Effort normal and breath sounds normal. No respiratory distress. She has no wheezes. She has no rales. Right breast exhibits no inverted nipple, no mass, no nipple discharge, no skin change and no tenderness. Left breast exhibits mass. Left breast exhibits no inverted nipple, no nipple discharge, no skin change and no tenderness. Breasts are symmetrical.    Lymphadenopathy:    She has no cervical adenopathy.  Skin: She is not diaphoretic.  Vitals reviewed.       Assessment & Plan:     1. Mass of breast, left Will get US and diagnostic mammogram. Will f/u pending results. If needed will refer to Dr. Lemar LivingsByrnett for biopsy. - US BREAST COMPLETE UNI LEFT INC AXILLA; Future - MM Digital Diagnostic Unilat L; Future       Margaretann LovelessJennifer M Burnette, PA-C  [facility]

## 2015-09-03 NOTE — Patient Instructions (Signed)
Mammogram A mammogram is an X-ray of the breasts that is done to check for abnormal changes. This procedure can screen for and detect any changes that may suggest breast cancer. A mammogram can also identify other changes and variations in the breast, such as:  Inflammation of the breast tissue (mastitis).  An infected area that contains a collection of pus (abscess).  A fluid-filled sac (cyst).  Fibrocystic changes. This is when breast tissue becomes denser, which can make the tissue feel rope-like or uneven under the skin.  Tumors that are not cancerous (benign). LET YOUR HEALTH CARE PROVIDER KNOW ABOUT:  Any allergies you have.  If you have breast implants.  If you have had previous breast disease, biopsy, or surgery.  If you are breastfeeding.  Any possibility that you could be pregnant, if this applies.  If you are younger than age 25.  If you have a family history of breast cancer. RISKS AND COMPLICATIONS Generally, this is a safe procedure. However, problems may occur, including:  Exposure to radiation. Radiation levels are very low with this test.  The results being misinterpreted.  The need for further tests.  The inability of the mammogram to detect certain cancers. BEFORE THE PROCEDURE  Schedule your test about 1-2 weeks after your menstrual period. This is usually when your breasts are the least tender.  If you have had a mammogram done at a different facility in the past, get the mammogram X-rays or have them sent to your current exam facility in order to compare them.  Wash your breasts and under your arms the day of the test.  Do not wear deodorants, perfumes, lotions, or powders anywhere on your body on the day of the test.  Remove any jewelry from your neck.  Wear clothes that you can change into and out of easily. PROCEDURE  You will undress from the waist up and put on a gown.  You will stand in front of the X-ray machine.  Each breast will  be placed between two plastic or glass plates. The plates will compress your breast for a few seconds. Try to stay as relaxed as possible during the procedure. This does not cause any harm to your breasts and any discomfort you feel will be very brief.  X-rays will be taken from different angles of each breast. The procedure may vary among health care providers and hospitals. AFTER THE PROCEDURE  The mammogram will be examined by a specialist (radiologist).  You may need to repeat certain parts of the test, depending on the quality of the images. This is commonly done if the radiologist needs a better view of the breast tissue.  Ask when your test results will be ready. Make sure you get your test results.  You may resume your normal activities.   This information is not intended to replace advice given to you by your health care provider. Make sure you discuss any questions you have with your health care provider.   Document Released: 02/06/2000 Document Revised: 10/30/2014 Document Reviewed: 04/19/2014 Elsevier Interactive Patient Education 2016 [employer]  

## 2015-09-12 ENCOUNTER — Ambulatory Visit
Admission: RE | Admit: 2015-09-12 | Discharge: 2015-09-12 | Disposition: A | Discharge status: Home / Self Care | Payer: Medicare PPO | Source: Ambulatory Visit | Attending: Physician Assistant | Admitting: Physician Assistant

## 2015-09-12 ENCOUNTER — Other Ambulatory Visit: Payer: Self-pay | Admitting: Physician Assistant

## 2015-09-12 ENCOUNTER — Ambulatory Visit
Admission: RE | Admit: 2015-09-12 | Discharge: 2015-09-12 | Disposition: A | Discharge status: Home / Self Care | Payer: Medicare Other | Source: Ambulatory Visit | Attending: Physician Assistant | Admitting: Physician Assistant

## 2015-09-12 DIAGNOSIS — N63 Unspecified lump in breast: Secondary | ICD-10-CM | POA: Insufficient documentation

## 2015-09-12 DIAGNOSIS — N6320 Unspecified lump in the left breast, unspecified quadrant: Secondary | ICD-10-CM

## 2015-09-17 ENCOUNTER — Other Ambulatory Visit: Payer: Self-pay | Admitting: Physician Assistant

## 2015-09-17 DIAGNOSIS — N6002 Solitary cyst of left breast: Secondary | ICD-10-CM

## 2015-09-17 DIAGNOSIS — N6320 Unspecified lump in the left breast, unspecified quadrant: Secondary | ICD-10-CM

## 2015-10-03 ENCOUNTER — Other Ambulatory Visit: Payer: Self-pay | Admitting: Physician Assistant

## 2015-10-03 ENCOUNTER — Ambulatory Visit
Admission: RE | Admit: 2015-10-03 | Discharge: 2015-10-03 | Disposition: A | Discharge status: Home / Self Care | Payer: Medicare Other | Source: Ambulatory Visit | Attending: Physician Assistant | Admitting: Physician Assistant

## 2015-10-03 DIAGNOSIS — N6002 Solitary cyst of left breast: Secondary | ICD-10-CM | POA: Diagnosis not present

## 2015-10-03 DIAGNOSIS — N6320 Unspecified lump in the left breast, unspecified quadrant: Secondary | ICD-10-CM

## 2015-10-03 DIAGNOSIS — N63 Unspecified lump in breast: Secondary | ICD-10-CM | POA: Diagnosis present

## 2015-10-03 DIAGNOSIS — N6082 Other benign mammary dysplasias of left breast: Secondary | ICD-10-CM | POA: Diagnosis not present

## 2015-10-06 LAB — SURGICAL PATHOLOGY

## 2015-11-24 ENCOUNTER — Other Ambulatory Visit: Payer: Self-pay | Admitting: Family Medicine

## 2015-11-24 DIAGNOSIS — E890 Postprocedural hypothyroidism: Secondary | ICD-10-CM

## 2016-04-12 ENCOUNTER — Other Ambulatory Visit: Payer: Self-pay | Admitting: Physician Assistant

## 2016-04-12 DIAGNOSIS — E890 Postprocedural hypothyroidism: Secondary | ICD-10-CM

## 2016-04-12 DIAGNOSIS — N3941 Urge incontinence: Secondary | ICD-10-CM

## 2016-04-12 MED ORDER — SOLIFENACIN SUCCINATE 10 MG PO TABS: 10.0000 mg | ORAL_TABLET | Freq: Every day | ORAL | 1 refills | Status: DC

## 2016-04-12 NOTE — Telephone Encounter (Signed)
[facility]

## 2016-04-12 NOTE — Telephone Encounter (Signed)
Pt contacted office for refill request on the following medications: VESICARE 10 MG tablet Pt stated that she only has 1 pill left she thought she had more refills and pt stated that she can't go without this medication. [facility]. Pt is scheduled for her CPE on 04/29/16. Please advise. Thanks TNP

## 2016-04-29 ENCOUNTER — Encounter: Payer: Self-pay | Admitting: Physician Assistant

## 2016-04-29 ENCOUNTER — Ambulatory Visit (INDEPENDENT_AMBULATORY_CARE_PROVIDER_SITE_OTHER): Payer: Medicare Other

## 2016-04-29 ENCOUNTER — Ambulatory Visit (INDEPENDENT_AMBULATORY_CARE_PROVIDER_SITE_OTHER): Payer: Medicare Other | Admitting: Physician Assistant

## 2016-04-29 VITALS — BP 158/82 | HR 68 | Temp 97.8°F | Ht 64.0 in | Wt 126.8 lb

## 2016-04-29 DIAGNOSIS — Z23 Encounter for immunization: Secondary | ICD-10-CM | POA: Diagnosis not present

## 2016-04-29 DIAGNOSIS — R413 Other amnesia: Secondary | ICD-10-CM | POA: Diagnosis not present

## 2016-04-29 DIAGNOSIS — N63 Unspecified lump in unspecified breast: Secondary | ICD-10-CM

## 2016-04-29 DIAGNOSIS — Z0000 Encounter for general adult medical examination without abnormal findings: Secondary | ICD-10-CM | POA: Diagnosis not present

## 2016-04-29 DIAGNOSIS — E7800 Pure hypercholesterolemia, unspecified: Secondary | ICD-10-CM | POA: Diagnosis not present

## 2016-04-29 DIAGNOSIS — R7309 Other abnormal glucose: Secondary | ICD-10-CM

## 2016-04-29 DIAGNOSIS — N8110 Cystocele, unspecified: Secondary | ICD-10-CM

## 2016-04-29 DIAGNOSIS — N3941 Urge incontinence: Secondary | ICD-10-CM | POA: Diagnosis not present

## 2016-04-29 DIAGNOSIS — E890 Postprocedural hypothyroidism: Secondary | ICD-10-CM

## 2016-04-29 MED ORDER — SOLIFENACIN SUCCINATE 10 MG PO TABS: 10.0000 mg | ORAL_TABLET | Freq: Every day | ORAL | 1 refills | Status: DC

## 2016-04-29 MED ORDER — LEVOTHYROXINE SODIUM 100 MCG PO TABS: 100.0000 ug | ORAL_TABLET | Freq: Every day | ORAL | 1 refills | Status: DC

## 2016-04-29 NOTE — Patient Instructions (Signed)
Health Maintenance for Postmenopausal Women Menopause is a normal process in which your reproductive ability comes to an end. This process happens gradually over a span of months to years, usually between the ages of 33 and 38. Menopause is complete when you have missed 12 consecutive menstrual periods. It is important to talk with your health care provider about some of the most common conditions that affect postmenopausal women, such as heart disease, cancer, and bone loss (osteoporosis). Adopting a healthy lifestyle and getting preventive care can help to promote your health and wellness. Those actions can also lower your chances of developing some of these common conditions. What should I know about menopause? During menopause, you may experience a number of symptoms, such as:  Moderate-to-severe hot flashes.  Night sweats.  Decrease in sex drive.  Mood swings.  Headaches.  Tiredness.  Irritability.  Memory problems.  Insomnia. Choosing to treat or not to treat menopausal changes is an individual decision that you make with your health care provider. What should I know about hormone replacement therapy and supplements? Hormone therapy products are effective for treating symptoms that are associated with menopause, such as hot flashes and night sweats. Hormone replacement carries certain risks, especially as you become older. If you are thinking about using estrogen or estrogen with progestin treatments, discuss the benefits and risks with your health care provider. What should I know about heart disease and stroke? Heart disease, heart attack, and stroke become more likely as you age. This may be due, in part, to the hormonal changes that your body experiences during menopause. These can affect how your body processes dietary fats, triglycerides, and cholesterol. Heart attack and stroke are both medical emergencies. There are many things that you can do to help prevent heart disease  and stroke:  Have your blood pressure checked at least every 1-2 years. High blood pressure causes heart disease and increases the risk of stroke.  If you are 48-61 years old, ask your health care provider if you should take aspirin to prevent a heart attack or a stroke.  Do not use any tobacco products, including cigarettes, chewing tobacco, or electronic cigarettes. If you need help quitting, ask your health care provider.  It is important to eat a healthy diet and maintain a healthy weight.  Be sure to include plenty of vegetables, fruits, low-fat dairy products, and lean protein.  Avoid eating foods that are high in solid fats, added sugars, or salt (sodium).  Get regular exercise. This is one of the most important things that you can do for your health.  Try to exercise for at least 150 minutes each week. The type of exercise that you do should increase your heart rate and make you sweat. This is known as moderate-intensity exercise.  Try to do strengthening exercises at least twice each week. Do these in addition to the moderate-intensity exercise.  Know your numbers.Ask your health care provider to check your cholesterol and your blood glucose. Continue to have your blood tested as directed by your health care provider. What should I know about cancer screening? There are several types of cancer. Take the following steps to reduce your risk and to catch any cancer development as early as possible. Breast Cancer  Practice breast self-awareness.  This means understanding how your breasts normally appear and feel.  It also means doing regular breast self-exams. Let your health care provider know about any changes, no matter how small.  If you are 40 or older,  have a clinician do a breast exam (clinical breast exam or CBE) every year. Depending on your age, family history, and medical history, it may be recommended that you also have a yearly breast X-ray (mammogram).  If you  have a family history of breast cancer, talk with your health care provider about genetic screening.  If you are at high risk for breast cancer, talk with your health care provider about having an MRI and a mammogram every year.  Breast cancer (BRCA) gene test is recommended for women who have family members with BRCA-related cancers. Results of the assessment will determine the need for genetic counseling and BRCA1 and for BRCA2 testing. BRCA-related cancers include these types:  Breast. This occurs in males or females.  Ovarian.  Tubal. This may also be called fallopian tube cancer.  Cancer of the abdominal or pelvic lining (peritoneal cancer).  Prostate.  Pancreatic. Cervical, Uterine, and Ovarian Cancer  Your health care provider may recommend that you be screened regularly for cancer of the pelvic organs. These include your ovaries, uterus, and vagina. This screening involves a pelvic exam, which includes checking for microscopic changes to the surface of your cervix (Pap test).  For women ages 21-65, health care providers may recommend a pelvic exam and a Pap test every three years. For women ages 23-65, they may recommend the Pap test and pelvic exam, combined with testing for human papilloma virus (HPV), every five years. Some types of HPV increase your risk of cervical cancer. Testing for HPV may also be done on women of any age who have unclear Pap test results.  Other health care providers may not recommend any screening for nonpregnant women who are considered low risk for pelvic cancer and have no symptoms. Ask your health care provider if a screening pelvic exam is right for you.  If you have had past treatment for cervical cancer or a condition that could lead to cancer, you need Pap tests and screening for cancer for at least 20 years after your treatment. If Pap tests have been discontinued for you, your risk factors (such as having a new sexual partner) need to be reassessed  to determine if you should start having screenings again. Some women have medical problems that increase the chance of getting cervical cancer. In these cases, your health care provider may recommend that you have screening and Pap tests more often.  If you have a family history of uterine cancer or ovarian cancer, talk with your health care provider about genetic screening.  If you have vaginal bleeding after reaching menopause, tell your health care provider.  There are currently no reliable tests available to screen for ovarian cancer. Lung Cancer  Lung cancer screening is recommended for adults 99-83 years old who are at high risk for lung cancer because of a history of smoking. A yearly low-dose CT scan of the lungs is recommended if you:  Currently smoke.  Have a history of at least 30 pack-years of smoking and you currently smoke or have quit within the past 15 years. A pack-year is smoking an average of one pack of cigarettes per day for one year. Yearly screening should:  Continue until it has been 15 years since you quit.  Stop if you develop a health problem that would prevent you from having lung cancer treatment. Colorectal Cancer  This type of cancer can be detected and can often be prevented.  Routine colorectal cancer screening usually begins at age 72 and continues  through age 75.  If you have risk factors for colon cancer, your health care provider may recommend that you be screened at an earlier age.  If you have a family history of colorectal cancer, talk with your health care provider about genetic screening.  Your health care provider may also recommend using home test kits to check for hidden blood in your stool.  A small camera at the end of a tube can be used to examine your colon directly (sigmoidoscopy or colonoscopy). This is done to check for the earliest forms of colorectal cancer.  Direct examination of the colon should be repeated every 5-10 years until  age 75. However, if early forms of precancerous polyps or small growths are found or if you have a family history or genetic risk for colorectal cancer, you may need to be screened more often. Skin Cancer  Check your skin from head to toe regularly.  Monitor any moles. Be sure to tell your health care provider:  About any new moles or changes in moles, especially if there is a change in a mole's shape or color.  If you have a mole that is larger than the size of a pencil eraser.  If any of your family members has a history of skin cancer, especially at a young age, talk with your health care provider about genetic screening.  Always use sunscreen. Apply sunscreen liberally and repeatedly throughout the day.  Whenever you are outside, protect yourself by wearing long sleeves, pants, a wide-brimmed hat, and sunglasses. What should I know about osteoporosis? Osteoporosis is a condition in which bone destruction happens more quickly than new bone creation. After menopause, you may be at an increased risk for osteoporosis. To help prevent osteoporosis or the bone fractures that can happen because of osteoporosis, the following is recommended:  If you are 19-50 years old, get at least 1,000 mg of calcium and at least 600 mg of vitamin D per day.  If you are older than age 50 but younger than age 70, get at least 1,200 mg of calcium and at least 600 mg of vitamin D per day.  If you are older than age 70, get at least 1,200 mg of calcium and at least 800 mg of vitamin D per day. Smoking and excessive alcohol intake increase the risk of osteoporosis. Eat foods that are rich in calcium and vitamin D, and do weight-bearing exercises several times each week as directed by your health care provider. What should I know about how menopause affects my mental health? Depression may occur at any age, but it is more common as you become older. Common symptoms of depression include:  Low or sad  mood.  Changes in sleep patterns.  Changes in appetite or eating patterns.  Feeling an overall lack of motivation or enjoyment of activities that you previously enjoyed.  Frequent crying spells. Talk with your health care provider if you think that you are experiencing depression. What should I know about immunizations? It is important that you get and maintain your immunizations. These include:  Tetanus, diphtheria, and pertussis (Tdap) booster vaccine.  Influenza every year before the flu season begins.  Pneumonia vaccine.  Shingles vaccine. Your health care provider may also recommend other immunizations. This information is not intended to replace advice given to you by your health care provider. Make sure you discuss any questions you have with your health care provider. Document Released: 04/02/2005 Document Revised: 08/29/2015 Document Reviewed: 11/12/2014 Elsevier Interactive Patient   Education  2017 [employer]  

## 2016-04-29 NOTE — Progress Notes (Signed)
Patient: Katelyn Baker Female    DOB: Jan 19, 1946   71 y.o.   MRN: [id number] Visit Date: 04/29/2016  Today's Provider: [name], PA-C   Chief Complaint  Patient presents with  . Follow-up   Subjective:    HPI  Patient today for F/U after wellness visit. 04/29/15 AWE 10/03/15 Mammogram-BI-RADS 4 09/04/09 Colonoscopy-diverticulosis, Dr. Skeet Simmer 12/16/14 BMD-Normal; T score -0.2   Prediabetes, Follow-up:   Lab Results  Component Value Date   HGBA1C 5.5 04/30/2015   HGBA1C 5.7 (H) 08/28/2014   GLUCOSE 86 04/30/2015   GLUCOSE 107 (H) 08/28/2014    Last seen for for this1 years ago.  Management since that visit includes check labs. Current symptoms include none and have been stable.  Weight trend: stable Prior visit with dietician: no Current diet: in general, a "healthy" diet   Current exercise: housecleaning, pt reports taking care of grandchildren every day.  Pertinent Labs:    Component Value Date/Time   CHOL 187 04/30/2015 0913   TRIG 97 04/30/2015 0913   CREATININE 0.80 04/30/2015 0913    Wt Readings from Last 3 Encounters:  04/29/16 126 lb 12.8 oz (57.5 kg)  09/03/15 126 lb (57.2 kg)  04/29/15 129 lb (58.5 kg)    Lipid/Cholesterol, Follow-up:   Last seen for this1 years ago.  Management changes since that visit include check labs. . Last Lipid Panel:    Component Value Date/Time   CHOL 187 04/30/2015 0913   TRIG 97 04/30/2015 0913   HDL 53 04/30/2015 0913   LDLCALC 115 (H) 04/30/2015 0913    Risk factors for vascular disease include hypercholesterolemia   Wt Readings from Last 3 Encounters:  04/29/16 126 lb 12.8 oz (57.5 kg)  09/03/15 126 lb (57.2 kg)  04/29/15 129 lb (58.5 kg)  -------------------------------------------------------------------   Follow up for urge incontinence of urine  The patient was last seen for this 1 years ago. Changes made at last visit include no changes.  She reports excellent compliance  with treatment. She feels that condition is Improved. She is not having side effects.   ------------------------------------------------------------------------------------ Patient is C/O of memory problems worsening or progressing in the last several months. Patient reports today she could not figure out how to get here. Patient reports that her mother has dementia. She reports she is somewhat concerned but not terribly, her husband and daughter are more concerned.   6CIT Screen 04/29/2016  What Year? 0 points  What month? 0 points  What time? 0 points  Count back from 20 0 points  Months in reverse 0 points  Repeat phrase 6 points  Total Score 6       No Known Allergies   Current Outpatient Prescriptions:  .  Calcium Carbonate (CALCIUM 600 PO), Take by mouth., Disp: , Rfl:  .  Cyanocobalamin (RA VITAMIN B-12 TR) 1000 MCG TBCR, Take by mouth., Disp: , Rfl:  .  levothyroxine (SYNTHROID, LEVOTHROID) 100 MCG tablet, TAKE ONE TABLET BY MOUTH ONCE DAILY, Disp: 90 tablet, Rfl: 1 .  solifenacin (VESICARE) 10 MG tablet, Take 1 tablet (10 mg total) by mouth daily., Disp: 90 tablet, Rfl: 1 .  VITAMIN D, ERGOCALCIFEROL, PO, Take 2,000 Int'l Units by mouth daily. , Disp: , Rfl:   Review of Systems  Constitutional: Positive for activity change.  HENT: Negative.   Eyes: Negative.   Respiratory: Negative.   Cardiovascular: Negative.   Gastrointestinal: Negative.   Endocrine: Negative.   Genitourinary: Negative.  Musculoskeletal: Negative.   Skin: Negative.   Allergic/Immunologic: Negative.   Neurological: Negative.   Hematological: Negative.   Psychiatric/Behavioral: Positive for confusion.    Social History  Substance Use Topics  . Smoking status: Never Smoker  . Smokeless tobacco: Never Used  . Alcohol use No   Objective:   Vitals: BP (!) 158/82 (BP Location: Right Arm)   Pulse 68   Temp 97.8 F (36.6 C) (Oral)   Ht 5\' 4"  (1.626 m)   Wt 126 lb 12.8 oz (57.5 kg)   BMI  21.77 kg/m   Body mass index is 21.77 kg/m.  Functional Status Survey:   [facility] 04/29/2016 04/29/2015 11/21/2014 08/27/2014  Decreased Interest 0 0 0 0  Down, Depressed, Hopeless 0 0 0 0  PHQ - 2 Score 0 0 0 0   Fall Risk  04/29/2016 04/29/2015 11/21/2014 08/27/2014  Falls in the past year? No No No No   Dep[facility] screen [facility] 2/9 04/29/2016 04/29/2015 11/21/2014 08/27/2014  Decreased Interest 0 0 0 0  Down, Depressed, Hopeless 0 0 0 0  PHQ - 2 Score 0 0 0 0   6CIT Screen 04/29/2016  What Year? 0 points  What month? 0 points  What time? 0 points  Count back from 20 0 points  Months in reverse 0 points  Repeat phrase 6 points  Total Score 6   Audit-C Alcohol Use Screening  Question Answer Points  How often do you have alcoholic drink? never 0  On days you do drink alcohol, how many drinks do you typically consume? 0 0  How oftey will you drink 6 or more in a total? never 0  Total Score:  0   A score of 3 or more in women, and 4 or more in men indicates increased risk for alcohol abuse, EXCEPT if all of the points are from question 1.   Physical Exam  Constitutional: She is oriented to person, place, and time. She appears well-developed and well-nourished. No distress.  HENT:  Head: Normocephalic and atraumatic.  Right Ear: Hearing, tympanic membrane, external ear and ear canal normal.  Left Ear: Hearing, tympanic membrane, external ear and ear canal normal.  Nose: Nose normal.  Mouth/Throat: Uvula is midline, oropharynx is clear and moist and mucous membranes are normal. No oropharyngeal exudate.  Eyes: Conjunctivae and EOM are normal. Pupils are equal, round, and reactive to light. Right eye exhibits no discharge. Left eye exhibits no discharge. No scleral icterus.  Neck: Normal range of motion. Neck supple. No JVD present. Carotid bruit is not present. No tracheal deviation present. No thyromegaly present.  Cardiovascular: Normal rate, regular rhythm, normal heart sounds  and intact distal pulses.  Exam reveals no gallop and no friction rub.   No murmur heard. Pulmonary/Chest: Effort normal and breath sounds normal. No respiratory distress. She has no wheezes. She has no rales. She exhibits no tenderness. Right breast exhibits no inverted nipple, no mass, no nipple discharge, no skin change and no tenderness. Left breast exhibits mass. Left breast exhibits no inverted nipple, no nipple discharge, no skin change and no tenderness. Breasts are symmetrical.    Abdominal: Soft. Bowel sounds are normal. She exhibits no distension and no mass. There is no tenderness. There is no rebound and no guarding.  Musculoskeletal: Normal range of motion. She exhibits no edema or tenderness.  Lymphadenopathy:    She has no cervical adenopathy.  Neurological: She is alert and oriented to person, place, and time.  No cranial nerve deficit. Coordination normal.  Skin: Skin is warm and dry. No rash noted. She is not diaphoretic.  Psychiatric: She has a normal mood and affect. Her behavior is normal. Judgment and thought content normal.  Vitals reviewed.     Assessment & Plan:     1. Urge incontinence of urine Stable. Diagnosis pulled for medication refill. Continue current medical treatment plan. - solifenacin (VESICARE) 10 MG tablet; Take 1 tablet (10 mg total) by mouth daily.  Dispense: 90 tablet; Refill: 1  2. Memory loss Discussed in detail multiple options including medications and further evaluation. She states she does not want any medication at this time. Offered neurocognitive testing which she also declined at this time. I did give information on Vayacog and advised her to give to her daughter to see if this may be something they are interested in instead.   3. Abnormal blood sugar Will check labs as below and f/u pending results. - Hemoglobin A1c  4. Hypercholesteremia Stable. Diet controlled. Will check labs as below and f/u pending results. - Comprehensive  metabolic panel - Lipid Panel With LDL/HDL Ratio  5. Hypothyroidism, postop Stable. Diagnosis pulled for medication refill. Continue current medical treatment plan. Will check labs as below and f/u pending results. - levothyroxine (SYNTHROID, LEVOTHROID) 100 MCG tablet; Take 1 tablet (100 mcg total) by mouth daily.  Dispense: 90 tablet; Refill: 1 - CBC with Differential/Platelet - TSH  6. Female cystocele Uses vesicare for OAB currently. Declines wanting referral for GYN to consider pessary vs surgical intervention at this time. Will wait to see if it progresses.   7. Breast lump Known breast lump palpated again today on exam. It is same area that was biopsied in Aug 2017 and was benign calcification. Repeat screening mammogram due in 09/2016.       [name], PA-C  [facility]

## 2016-04-29 NOTE — Progress Notes (Signed)
Subjective:   Katelyn Baker is a 71 y.o. female who presents for Medicare Annual (Subsequent) preventive examination.  Review of Systems:  N/A  Cardiac Risk Factors include: advanced age (>62men, >14 women);dyslipidemia     Objective:     Vitals: BP (!) 158/82 (BP Location: Right Arm)   Pulse 68   Temp 97.8 F (36.6 C) (Oral)   Ht 5\' 4"  (1.626 m)   Wt 126 lb 12.8 oz (57.5 kg)   BMI 21.77 kg/m   Body mass index is 21.77 kg/m.   Tobacco History  Smoking Status  . Never Smoker  Smokeless Tobacco  . Never Used     Counseling given: Not Answered   Past Medical History:  Diagnosis Date  . GERD (gastroesophageal reflux disease)   . Hyperlipidemia   . Thyroid disease    Past Surgical History:  Procedure Laterality Date  . ABDOMINAL HYSTERECTOMY     partial  . BREAST BIOPSY Left 1980'S   EXCISIONAL - NEG  . BREAST SURGERY  2000's   biopsy  . THYROIDECTOMY, PARTIAL  1990's   Family History  Problem Relation Age of Onset  . Alzheimer's disease Mother   . Diabetes Mother     ?  Marland Kitchen Alcohol abuse Father   . Cancer Father     unknown cancer  . Diabetes Other   . Heart disease Other   . Cancer Other   . Colon polyps Other   . Breast cancer Neg Hx    History  Sexual Activity  . Sexual activity: Not on file    Outpatient Encounter Prescriptions as of 04/29/2016  Medication Sig  . levothyroxine (SYNTHROID, LEVOTHROID) 100 MCG tablet TAKE ONE TABLET BY MOUTH ONCE DAILY  . solifenacin (VESICARE) 10 MG tablet Take 1 tablet (10 mg total) by mouth daily.  . Calcium Carbonate (CALCIUM 600 PO) Take by mouth.  . Cyanocobalamin (RA VITAMIN B-12 TR) 1000 MCG TBCR Take by mouth.  Marland Kitchen VITAMIN D, ERGOCALCIFEROL, PO Take 2,000 Int'l Units by mouth daily.    No facility-administered encounter medications on file as of 04/29/2016.     Activities of Daily Living In your present state of health, do you have any difficulty performing the following activities: 04/29/2016    Hearing? N  Vision? N  Difficulty concentrating or making decisions? Y  Walking or climbing stairs? N  Dressing or bathing? N  Doing errands, shopping? N  Preparing Food and eating ? N  Using the Toilet? N  In the past six months, have you accidently leaked urine? Y  Do you have problems with loss of bowel control? N  Managing your Medications? N  Managing your Finances? N  Housekeeping or managing your Housekeeping? N  Some recent data might be hidden    Patient Care Team: [name], PA-C as PCP - General (Family Medicine)    Assessment:    Exercise Activities and Dietary recommendations Current Exercise Habits: The patient does not participate in regular exercise at present (busy babysitting grandchildren), Exercise limited by: Other - see comments (does not have time)  Goals    . Increase water intake          Recommend increasing water intake to 3 glasses a day.      Fall Risk Fall Risk  04/29/2016 04/29/2015 11/21/2014 08/27/2014  Falls in the past year? No No No No   Depression Screen PHQ 2/9 Scores 04/29/2016 04/29/2015 11/21/2014 08/27/2014  PHQ - 2 Score 0 0  0 0     Cognitive Function     6CIT Screen 04/29/2016  What Year? 0 points  What month? 0 points  What time? 0 points  Count back from 20 0 points  Months in reverse 0 points  Repeat phrase 6 points  Total Score 6    Immunization History  Administered Date(s) Administered  . Influenza Split 12/02/2011  . Influenza, High Dose Seasonal PF 11/21/2014, 04/29/2016  . Pneumococcal Conjugate-13 09/30/2013  . Pneumococcal Polysaccharide-23 12/02/2011  . Tdap 12/16/2005  . Zoster 09/30/2013   Screening Tests Health Maintenance  Topic Date Due  . Hepatitis C Screening  04/22/2017 (Originally [birth year])  . TETANUS/TDAP  02/22/2026 (Originally 12/17/2015)  . MAMMOGRAM  12/02/2016  . COLONOSCOPY  09/05/2019  . INFLUENZA VACCINE  Completed  . DEXA SCAN  Completed  . PNA vac Low Risk Adult  Completed       Plan:  I have personally reviewed and addressed the Medicare Annual Wellness questionnaire and have noted the following in the patient's chart:  A. Medical and social history B. Use of alcohol, tobacco or illicit drugs  C. Current medications and supplements D. Functional ability and status E.  Nutritional status F.  Physical activity G. Advance directives H. List of other physicians I.  Hospitalizations, surgeries, and ER visits in previous 12 months J.  Vitals K. Screenings such as hearing and vision if needed, cognitive and depression L. Referrals and appointments - none  In addition, I have reviewed and discussed with patient certain preventive protocols, quality metrics, and best practice recommendations. A written personalized care plan for preventive services as well as general preventive health recommendations were provided to patient.  See attached scanned questionnaire for additional information.   Signed,  Hyacinth MeekerMckenzie Kynsley Whitehouse, LPN Nurse Health Advisor   MD Recommendations: None. Pt declined tetanus and Hepatitis C screening today.  I have reviewed the documentation and information obtained by Hyacinth MeekerMcKenzie Omauri Boeve, LPN in the above chart and agree as above. I was available for consultation if any questions or issues arose.  Joycelyn ManJennifer Burnette, PA-C

## 2016-04-29 NOTE — Patient Instructions (Signed)

## 2016-04-30 LAB — COMPREHENSIVE METABOLIC PANEL
ALT: 15 IU/L (ref 0–32)
AST: 19 IU/L (ref 0–40)
Albumin/Globulin Ratio: 1.7 (ref 1.2–2.2)
Albumin: 4.5 g/dL (ref 3.6–4.8)
Alkaline Phosphatase: 56 IU/L (ref 39–117)
BUN/Creatinine Ratio: 12 (ref 12–28)
BUN: 9 mg/dL (ref 8–27)
Bilirubin Total: 0.5 mg/dL (ref 0.0–1.2)
CO2: 28 mmol/L (ref 18–29)
Calcium: 9.6 mg/dL (ref 8.7–10.3)
Chloride: 102 mmol/L (ref 96–106)
Creatinine, Ser: 0.77 mg/dL (ref 0.57–1.00)
GFR calc Af Amer: 91 mL/min/{1.73_m2} (ref >59)
GFR calc non Af Amer: 79 mL/min/{1.73_m2} (ref >59)
Globulin, Total: 2.7 g/dL (ref 1.5–4.5)
Glucose: 91 mg/dL (ref 65–99)
Potassium: 4.5 mmol/L (ref 3.5–5.2)
Sodium: 142 mmol/L (ref 134–144)
Total Protein: 7.2 g/dL (ref 6.0–8.5)

## 2016-04-30 LAB — CBC WITH DIFFERENTIAL/PLATELET
Basophils Absolute: 0 10*3/uL (ref 0.0–0.2)
Basos: 0 %
EOS (ABSOLUTE): 0.4 10*3/uL (ref 0.0–0.4)
Eos: 5 %
Hematocrit: 42.1 % (ref 34.0–46.6)
Hemoglobin: 14.5 g/dL (ref 11.1–15.9)
Immature Grans (Abs): 0 10*3/uL (ref 0.0–0.1)
Immature Granulocytes: 0 %
Lymphocytes Absolute: 1.2 10*3/uL (ref 0.7–3.1)
Lymphs: 17 %
MCH: 31.5 pg (ref 26.6–33.0)
MCHC: 34.4 g/dL (ref 31.5–35.7)
MCV: 92 fL (ref 79–97)
Monocytes Absolute: 0.4 10*3/uL (ref 0.1–0.9)
Monocytes: 6 %
Neutrophils Absolute: 4.8 10*3/uL (ref 1.4–7.0)
Neutrophils: 72 %
Platelets: 188 10*3/uL (ref 150–379)
RBC: 4.6 x10E6/uL (ref 3.77–5.28)
RDW: 13.4 % (ref 12.3–15.4)
WBC: 6.7 10*3/uL (ref 3.4–10.8)

## 2016-04-30 LAB — HEMOGLOBIN A1C
Est. average glucose Bld gHb Est-mCnc: 100 mg/dL
Hgb A1c MFr Bld: 5.1 % (ref 4.8–5.6)

## 2016-04-30 LAB — LIPID PANEL WITH LDL/HDL RATIO
Cholesterol, Total: 206 mg/dL — ABNORMAL HIGH (ref 100–199)
HDL: 53 mg/dL (ref >39)
LDL Calculated: 126 mg/dL — ABNORMAL HIGH (ref 0–99)
LDl/HDL Ratio: 2.4 ratio units (ref 0.0–3.2)
Triglycerides: 137 mg/dL (ref 0–149)
VLDL Cholesterol Cal: 27 mg/dL (ref 5–40)

## 2016-04-30 LAB — TSH: TSH: 4.48 u[IU]/mL (ref 0.450–4.500)

## 2016-08-18 ENCOUNTER — Encounter: Payer: Self-pay | Admitting: Physician Assistant

## 2016-08-18 ENCOUNTER — Ambulatory Visit (INDEPENDENT_AMBULATORY_CARE_PROVIDER_SITE_OTHER): Payer: Medicare Other | Admitting: Physician Assistant

## 2016-08-18 VITALS — BP 138/84 | HR 58 | Temp 97.6°F | Wt 124.8 lb

## 2016-08-18 DIAGNOSIS — Z1231 Encounter for screening mammogram for malignant neoplasm of breast: Secondary | ICD-10-CM | POA: Diagnosis not present

## 2016-08-18 DIAGNOSIS — N3941 Urge incontinence: Secondary | ICD-10-CM

## 2016-08-18 DIAGNOSIS — Z0000 Encounter for general adult medical examination without abnormal findings: Secondary | ICD-10-CM | POA: Diagnosis not present

## 2016-08-18 DIAGNOSIS — E890 Postprocedural hypothyroidism: Secondary | ICD-10-CM | POA: Diagnosis not present

## 2016-08-18 DIAGNOSIS — Z1239 Encounter for other screening for malignant neoplasm of breast: Secondary | ICD-10-CM

## 2016-08-18 DIAGNOSIS — Z23 Encounter for immunization: Secondary | ICD-10-CM | POA: Diagnosis not present

## 2016-08-18 DIAGNOSIS — R413 Other amnesia: Secondary | ICD-10-CM

## 2016-08-18 MED ORDER — SOLIFENACIN SUCCINATE 10 MG PO TABS: 10.0000 mg | ORAL_TABLET | Freq: Every day | ORAL | 1 refills | Status: DC

## 2016-08-18 MED ORDER — LEVOTHYROXINE SODIUM 100 MCG PO TABS: 100.0000 ug | ORAL_TABLET | Freq: Every day | ORAL | 1 refills | Status: DC

## 2016-08-18 NOTE — Patient Instructions (Signed)
Health Maintenance for Postmenopausal Women Menopause is a normal process in which your reproductive ability comes to an end. This process happens gradually over a span of months to years, usually between the ages of 22 and 9. Menopause is complete when you have missed 12 consecutive menstrual periods. It is important to talk with your health care provider about some of the most common conditions that affect postmenopausal women, such as heart disease, cancer, and bone loss (osteoporosis). Adopting a healthy lifestyle and getting preventive care can help to promote your health and wellness. Those actions can also lower your chances of developing some of these common conditions. What should I know about menopause? During menopause, you may experience a number of symptoms, such as:  Moderate-to-severe hot flashes.  Night sweats.  Decrease in sex drive.  Mood swings.  Headaches.  Tiredness.  Irritability.  Memory problems.  Insomnia.  Choosing to treat or not to treat menopausal changes is an individual decision that you make with your health care provider. What should I know about hormone replacement therapy and supplements? Hormone therapy products are effective for treating symptoms that are associated with menopause, such as hot flashes and night sweats. Hormone replacement carries certain risks, especially as you become older. If you are thinking about using estrogen or estrogen with progestin treatments, discuss the benefits and risks with your health care provider. What should I know about heart disease and stroke? Heart disease, heart attack, and stroke become more likely as you age. This may be due, in part, to the hormonal changes that your body experiences during menopause. These can affect how your body processes dietary fats, triglycerides, and cholesterol. Heart attack and stroke are both medical emergencies. There are many things that you can do to help prevent heart disease  and stroke:  Have your blood pressure checked at least every 1-2 years. High blood pressure causes heart disease and increases the risk of stroke.  If you are 53-22 years old, ask your health care provider if you should take aspirin to prevent a heart attack or a stroke.  Do not use any tobacco products, including cigarettes, chewing tobacco, or electronic cigarettes. If you need help quitting, ask your health care provider.  It is important to eat a healthy diet and maintain a healthy weight. ? Be sure to include plenty of vegetables, fruits, low-fat dairy products, and lean protein. ? Avoid eating foods that are high in solid fats, added sugars, or salt (sodium).  Get regular exercise. This is one of the most important things that you can do for your health. ? Try to exercise for at least 150 minutes each week. The type of exercise that you do should increase your heart rate and make you sweat. This is known as moderate-intensity exercise. ? Try to do strengthening exercises at least twice each week. Do these in addition to the moderate-intensity exercise.  Know your numbers.Ask your health care provider to check your cholesterol and your blood glucose. Continue to have your blood tested as directed by your health care provider.  What should I know about cancer screening? There are several types of cancer. Take the following steps to reduce your risk and to catch any cancer development as early as possible. Breast Cancer  Practice breast self-awareness. ? This means understanding how your breasts normally appear and feel. ? It also means doing regular breast self-exams. Let your health care provider know about any changes, no matter how small.  If you are 40  or older, have a clinician do a breast exam (clinical breast exam or CBE) every year. Depending on your age, family history, and medical history, it may be recommended that you also have a yearly breast X-ray (mammogram).  If you  have a family history of breast cancer, talk with your health care provider about genetic screening.  If you are at high risk for breast cancer, talk with your health care provider about having an MRI and a mammogram every year.  Breast cancer (BRCA) gene test is recommended for women who have family members with BRCA-related cancers. Results of the assessment will determine the need for genetic counseling and BRCA1 and for BRCA2 testing. BRCA-related cancers include these types: ? Breast. This occurs in males or females. ? Ovarian. ? Tubal. This may also be called fallopian tube cancer. ? Cancer of the abdominal or pelvic lining (peritoneal cancer). ? Prostate. ? Pancreatic.  Cervical, Uterine, and Ovarian Cancer Your health care provider may recommend that you be screened regularly for cancer of the pelvic organs. These include your ovaries, uterus, and vagina. This screening involves a pelvic exam, which includes checking for microscopic changes to the surface of your cervix (Pap test).  For women ages 21-65, health care providers may recommend a pelvic exam and a Pap test every three years. For women ages 79-65, they may recommend the Pap test and pelvic exam, combined with testing for human papilloma virus (HPV), every five years. Some types of HPV increase your risk of cervical cancer. Testing for HPV may also be done on women of any age who have unclear Pap test results.  Other health care providers may not recommend any screening for nonpregnant women who are considered low risk for pelvic cancer and have no symptoms. Ask your health care provider if a screening pelvic exam is right for you.  If you have had past treatment for cervical cancer or a condition that could lead to cancer, you need Pap tests and screening for cancer for at least 20 years after your treatment. If Pap tests have been discontinued for you, your risk factors (such as having a new sexual partner) need to be  reassessed to determine if you should start having screenings again. Some women have medical problems that increase the chance of getting cervical cancer. In these cases, your health care provider may recommend that you have screening and Pap tests more often.  If you have a family history of uterine cancer or ovarian cancer, talk with your health care provider about genetic screening.  If you have vaginal bleeding after reaching menopause, tell your health care provider.  There are currently no reliable tests available to screen for ovarian cancer.  Lung Cancer Lung cancer screening is recommended for adults 69-62 years old who are at high risk for lung cancer because of a history of smoking. A yearly low-dose CT scan of the lungs is recommended if you:  Currently smoke.  Have a history of at least 30 pack-years of smoking and you currently smoke or have quit within the past 15 years. A pack-year is smoking an average of one pack of cigarettes per day for one year.  Yearly screening should:  Continue until it has been 15 years since you quit.  Stop if you develop a health problem that would prevent you from having lung cancer treatment.  Colorectal Cancer  This type of cancer can be detected and can often be prevented.  Routine colorectal cancer screening usually begins at  age 42 and continues through age 45.  If you have risk factors for colon cancer, your health care provider may recommend that you be screened at an earlier age.  If you have a family history of colorectal cancer, talk with your health care provider about genetic screening.  Your health care provider may also recommend using home test kits to check for hidden blood in your stool.  A small camera at the end of a tube can be used to examine your colon directly (sigmoidoscopy or colonoscopy). This is done to check for the earliest forms of colorectal cancer.  Direct examination of the colon should be repeated every  5-10 years until age 71. However, if early forms of precancerous polyps or small growths are found or if you have a family history or genetic risk for colorectal cancer, you may need to be screened more often.  Skin Cancer  Check your skin from head to toe regularly.  Monitor any moles. Be sure to tell your health care provider: ? About any new moles or changes in moles, especially if there is a change in a mole's shape or color. ? If you have a mole that is larger than the size of a pencil eraser.  If any of your family members has a history of skin cancer, especially at a young age, talk with your health care provider about genetic screening.  Always use sunscreen. Apply sunscreen liberally and repeatedly throughout the day.  Whenever you are outside, protect yourself by wearing long sleeves, pants, a wide-brimmed hat, and sunglasses.  What should I know about osteoporosis? Osteoporosis is a condition in which bone destruction happens more quickly than new bone creation. After menopause, you may be at an increased risk for osteoporosis. To help prevent osteoporosis or the bone fractures that can happen because of osteoporosis, the following is recommended:  If you are 46-71 years old, get at least 1,000 mg of calcium and at least 600 mg of vitamin D per day.  If you are older than age 55 but younger than age 65, get at least 1,200 mg of calcium and at least 600 mg of vitamin D per day.  If you are older than age 54, get at least 1,200 mg of calcium and at least 800 mg of vitamin D per day.  Smoking and excessive alcohol intake increase the risk of osteoporosis. Eat foods that are rich in calcium and vitamin D, and do weight-bearing exercises several times each week as directed by your health care provider. What should I know about how menopause affects my mental health? Depression may occur at any age, but it is more common as you become older. Common symptoms of depression  include:  Low or sad mood.  Changes in sleep patterns.  Changes in appetite or eating patterns.  Feeling an overall lack of motivation or enjoyment of activities that you previously enjoyed.  Frequent crying spells.  Talk with your health care provider if you think that you are experiencing depression. What should I know about immunizations? It is important that you get and maintain your immunizations. These include:  Tetanus, diphtheria, and pertussis (Tdap) booster vaccine.  Influenza every year before the flu season begins.  Pneumonia vaccine.  Shingles vaccine.  Your health care provider may also recommend other immunizations. This information is not intended to replace advice given to you by your health care provider. Make sure you discuss any questions you have with your health care provider. Document Released: 04/02/2005  Document Revised: 08/29/2015 Document Reviewed: 11/12/2014 Elsevier Interactive Patient Education  2018 Elsevier Inc.  

## 2016-08-18 NOTE — Progress Notes (Signed)
Patient: Katelyn Baker, Female    DOB: Nov 26, 1945, 71 y.o.   MRN: [id number] Visit Date: 08/18/2016  Today's Provider: [name], PA-C   Chief Complaint  Patient presents with  . Annual Exam   Subjective:    Annual physical exam Katelyn Baker is a 71 y.o. female who presents today for health maintenance and complete physical. She feels well. She reports exercising none. She reports she is sleeping well. ----------------------------------------------------------------- 04/29/2016- AWE 04/29/2015- CPE 09/12/15- Mammo WNL 12/16/2014- BMD Normal 7/142011- Colonoscopy- Diverticulosis; repeat 10 years   Review of Systems  Constitutional: Positive for activity change, appetite change and fatigue.  HENT: Negative.   Eyes: Negative.   Respiratory: Negative.   Cardiovascular: Negative.   Gastrointestinal: Negative.   Endocrine: Negative.   Genitourinary: Negative.   Musculoskeletal: Negative.   Skin: Negative.   Allergic/Immunologic: Negative.   Neurological: Negative.   Hematological: Negative.   Psychiatric/Behavioral: Positive for agitation and confusion.    Social History      She  reports that she has never smoked. She has never used smokeless tobacco. She reports that she does not drink alcohol or use drugs.       Social History   Social History  . Marital status: Married    Spouse name: Jonny Ruiz  . Number of children: 2  . Years of education: 13   Occupational History  . Retired    Social History Main Topics  . Smoking status: Never Smoker  . Smokeless tobacco: Never Used  . Alcohol use No  . Drug use: No  . Sexual activity: Not Asked   Other Topics Concern  . None   Social History Narrative  . None    Past Medical History:  Diagnosis Date  . GERD (gastroesophageal reflux disease)   . Hyperlipidemia   . Thyroid disease      Patient Active Problem List   Diagnosis Date Noted  . Abnormal weight loss 04/29/2015  . Urge  incontinence of urine 12/17/2014  . Memory loss 08/27/2014  . Screening breast examination 08/19/2014  . Breast lump 08/19/2014  . Hypercholesteremia 08/19/2014  . History of colon polyps 08/19/2014  . 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 Adult hypothyroidism 10/02/2009  . Avitaminosis D 10/02/2009  . Abnormal blood sugar 06/24/2007  . Abnormal LFTs 06/23/2007  . Acid reflux 12/16/2005  . Bowel disease 12/16/2005  . 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 ABDOMINAL HYSTERECTOMY     partial  . BREAST BIOPSY Left 1980'S   EXCISIONAL - NEG  . BREAST SURGERY  2000's   biopsy  . THYROIDECTOMY, PARTIAL  1990's    Family History        Family Status  Relation Status  . Mother Alive       currently in rest home  . Father Deceased at age 8's  . Sister Alive  . Brother Alive  . Sister Alive  . Sister Alive  . Brother Alive  . Other (Not Specified)  . Neg Hx (Not Specified)        Her family history includes Alcohol abuse in her father; Alzheimer's disease in her mother; Cancer in her father and other; Colon polyps in her other; Diabetes in her mother and other; Heart disease in her other.     No Known Allergies   Current Outpatient Prescriptions:  .  Calcium Carbonate (CALCIUM 600 PO), Take by mouth.,  Disp: , Rfl:  .  Cyanocobalamin (RA VITAMIN B-12 TR) 1000 MCG TBCR, Take by mouth., Disp: , Rfl:  .  levothyroxine (SYNTHROID, LEVOTHROID) 100 MCG tablet, Take 1 tablet (100 mcg total) by mouth daily., Disp: 90 tablet, Rfl: 1 .  solifenacin (VESICARE) 10 MG tablet, Take 1 tablet (10 mg total) by mouth daily., Disp: 90 tablet, Rfl: 1 .  VITAMIN D, ERGOCALCIFEROL, PO, Take 2,000 Int'l Units by mouth daily. , Disp: , Rfl:    Patient Care Team: [name], PA-C as PCP - General (Family Medicine)      Objective:   Vitals: BP 138/84 (BP Location: Left Arm, Patient Position: Sitting, Cuff Size:  Normal)   Pulse (!) 58   Temp 97.6 F (36.4 C) (Oral)   Wt 124 lb 12.8 oz (56.6 kg)   SpO2 98%   BMI 21.42 kg/m    Vitals:   08/18/16 1440  BP: 138/84  Pulse: (!) 58  Temp: 97.6 F (36.4 C)  TempSrc: Oral  SpO2: 98%  Weight: 124 lb 12.8 oz (56.6 kg)     Physical Exam  Constitutional: She is oriented to person, place, and time. She appears well-developed and well-nourished. No distress.  HENT:  Head: Normocephalic and atraumatic.  Right Ear: Hearing, tympanic membrane, external ear and ear canal normal.  Left Ear: Hearing, tympanic membrane, external ear and ear canal normal.  Nose: Nose normal.  Mouth/Throat: Uvula is midline, oropharynx is clear and moist and mucous membranes are normal. No oropharyngeal exudate.  Eyes: Conjunctivae and EOM are normal. Pupils are equal, round, and reactive to light. Right eye exhibits no discharge. Left eye exhibits no discharge. No scleral icterus.  Neck: Normal range of motion. Neck supple. No JVD present. Carotid bruit is not present. No tracheal deviation present. No thyromegaly present.  Cardiovascular: Normal rate, regular rhythm, normal heart sounds and intact distal pulses.  Exam reveals no gallop and no friction rub.   No murmur heard. Pulmonary/Chest: Effort normal and breath sounds normal. No respiratory distress. She has no wheezes. She has no rales. She exhibits no tenderness.  Abdominal: Soft. Bowel sounds are normal. She exhibits no distension and no mass. There is no tenderness. There is no rebound and no guarding.  Musculoskeletal: Normal range of motion. She exhibits no edema or tenderness.  Lymphadenopathy:    She has no cervical adenopathy.  Neurological: She is alert and oriented to person, place, and time.  Skin: Skin is warm and dry. No rash noted. She is not diaphoretic.  Psychiatric: She has a normal mood and affect. Her behavior is normal. Judgment and thought content normal.  Vitals reviewed.    Depression  Screen PHQ 2/9 Scores 04/29/2016 04/29/2015 11/21/2014 08/27/2014  PHQ - 2 Score 0 0 0 0      Assessment & Plan:     Routine Health Maintenance and Physical Exam  Exercise Activities and Dietary recommendations Goals    . Increase water intake          Recommend increasing water intake to 3 glasses a day.       Immunization History  Administered Date(s) Administered  . Influenza Split 12/02/2011  . Influenza, High Dose Seasonal PF 11/21/2014, 04/29/2016  . Pneumococcal Conjugate-13 09/30/2013  . Pneumococcal Polysaccharide-23 12/02/2011  . Td 08/18/2016  . Tdap 12/16/2005  . Zoster 09/30/2013    Health Maintenance  Topic Date Due  . Hepatitis C Screening  04/22/2017 (Originally [birth year])  . INFLUENZA VACCINE  09/22/2016  .  MAMMOGRAM  12/02/2016  . COLONOSCOPY  09/05/2019  . TETANUS/TDAP  08/19/2026  . DEXA SCAN  Completed  . PNA vac Low Risk Adult  Completed     Discussed health benefits of physical activity, and encouraged her to engage in regular exercise appropriate for her age and condition.    1. Annual physical exam Fairly normal exam today but patient's memory continues to decline.   2. Breast cancer screening Patient refuses mammogram this year. Performs self breast exams occasionally.   3. Memory loss I feel memory is worsening. Had trouble even remembering getting a tetanus vaccination that was just given to her today her in the office. We discussed medications and/or neurology referral but patient refuses at this time. Discussed her making lists and writing things down now to help her remember things better.   4. Urge incontinence of urine Stable. Diagnosis pulled for medication refill. Continue current medical treatment plan. - solifenacin (VESICARE) 10 MG tablet; Take 1 tablet (10 mg total) by mouth daily.  Dispense: 90 tablet; Refill: 1  5. Hypothyroidism, postop Stable. Diagnosis pulled for medication refill. Continue current medical treatment  plan. - levothyroxine (SYNTHROID, LEVOTHROID) 100 MCG tablet; Take 1 tablet (100 mcg total) by mouth daily.  Dispense: 90 tablet; Refill: 1  --------------------------------------------------------------------    Margaretann LovelessJennifer M Burnette, PA-C  [facility]

## 2017-01-21 ENCOUNTER — Other Ambulatory Visit: Payer: Self-pay

## 2017-01-21 ENCOUNTER — Ambulatory Visit (INDEPENDENT_AMBULATORY_CARE_PROVIDER_SITE_OTHER): Payer: Medicare Other | Admitting: Physician Assistant

## 2017-01-21 ENCOUNTER — Encounter: Payer: Self-pay | Admitting: Physician Assistant

## 2017-01-21 VITALS — BP 110/78 | HR 84 | Temp 98.2°F | Resp 16 | Ht 64.0 in | Wt 120.0 lb

## 2017-01-21 DIAGNOSIS — F0390 Unspecified dementia without behavioral disturbance: Secondary | ICD-10-CM | POA: Diagnosis not present

## 2017-01-21 DIAGNOSIS — N8110 Cystocele, unspecified: Secondary | ICD-10-CM | POA: Diagnosis not present

## 2017-01-21 MED ORDER — DONEPEZIL HCL 5 MG PO TABS: 5.0000 mg | ORAL_TABLET | Freq: Every day | ORAL | 1 refills | Status: DC

## 2017-01-21 NOTE — Patient Instructions (Signed)
Calcium 1200 mg daily Vit D 800 IU daily  Donepezil tablets What is this medicine? DONEPEZIL (doe NEP e zil) is used to treat mild to moderate dementia caused by Alzheimer's disease. This medicine may be used for other purposes; ask your health care provider or pharmacist if you have questions. COMMON BRAND NAME(S): Aricept What should I tell my health care provider before I take this medicine? They need to know if you have any of these conditions: -asthma or other lung disease -difficulty passing urine -head injury -heart disease -history of irregular heartbeat -liver disease -seizures (convulsions) -stomach or intestinal disease, ulcers or stomach bleeding -an unusual or allergic reaction to donepezil, other medicines, foods, dyes, or preservatives -pregnant or trying to get pregnant -breast-feeding How should I use this medicine? Take this medicine by mouth with a glass of water. Follow the directions on the prescription label. You may take this medicine with or without food. Take this medicine at regular intervals. This medicine is usually taken before bedtime. Do not take it more often than directed. Continue to take your medicine even if you feel better. Do not stop taking except on your doctor's advice. If you are taking the 23 mg donepezil tablet, swallow it whole; do not cut, crush, or chew it. Talk to your pediatrician regarding the use of this medicine in children. Special care may be needed. Overdosage: If you think you have taken too much of this medicine contact a poison control center or emergency room at once. NOTE: This medicine is only for you. Do not share this medicine with others. What if I miss a dose? If you miss a dose, take it as soon as you can. If it is almost time for your next dose, take only that dose, do not take double or extra doses. What may interact with this medicine? Do not take this medicine with any of the following medications: -certain medicines  for fungal infections like itraconazole, fluconazole, posaconazole, and voriconazole -cisapride -dextromethorphan; quinidine -dofetilide -dronedarone -pimozide -quinidine -thioridazine -ziprasidone This medicine may also interact with the following medications: -antihistamines for allergy, cough and cold -atropine -bethanechol -carbamazepine -certain medicines for bladder problems like oxybutynin, tolterodine -certain medicines for Parkinson's disease like benztropine, trihexyphenidyl -certain medicines for stomach problems like dicyclomine, hyoscyamine -certain medicines for travel sickness like scopolamine -dexamethasone -ipratropium -NSAIDs, medicines for pain and inflammation, like ibuprofen or naproxen -other medicines for Alzheimer's disease -other medicines that prolong the QT interval (cause an abnormal heart rhythm) -phenobarbital -phenytoin -rifampin, rifabutin or rifapentine This list may not describe all possible interactions. Give your health care provider a list of all the medicines, herbs, non-prescription drugs, or dietary supplements you use. Also tell them if you smoke, drink alcohol, or use illegal drugs. Some items may interact with your medicine. What should I watch for while using this medicine? Visit your doctor or health care professional for regular checks on your progress. Check with your doctor or health care professional if your symptoms do not get better or if they get worse. You may get drowsy or dizzy. Do not drive, use machinery, or do anything that needs mental alertness until you know how this drug affects you. What side effects may I notice from receiving this medicine? Side effects that you should report to your doctor or health care professional as soon as possible: -allergic reactions like skin rash, itching or hives, swelling of the face, lips, or tongue -feeling faint or lightheaded, falls -loss of bladder control -seizures -signs and  symptoms of a dangerous change in heartbeat or heart rhythm like chest pain; dizziness; fast or irregular heartbeat; palpitations; feeling faint or lightheaded, falls; breathing problems -signs and symptoms of infection like fever or chills; cough; sore throat; pain or trouble passing urine -signs and symptoms of liver injury like dark yellow or brown urine; general ill feeling or flu-like symptoms; light-colored stools; loss of appetite; nausea; right upper belly pain; unusually weak or tired; yellowing of the eyes or skin -slow heartbeat or palpitations -unusual bleeding or bruising -vomiting Side effects that usually do not require medical attention (report to your doctor or health care professional if they continue or are bothersome): -diarrhea, especially when starting treatment -headache -loss of appetite -muscle cramps -nausea -stomach upset This list may not describe all possible side effects. Call your doctor for medical advice about side effects. You may report side effects to FDA at 1-800-FDA-1088. Where should I keep my medicine? Keep out of reach of children. Store at room temperature between 15 and 30 degrees C (59 and 86 degrees F). Throw away any unused medicine after the expiration date. NOTE: This sheet is a summary. It may not cover all possible information. If you have questions about this medicine, talk to your doctor, pharmacist, or health care provider.  2018 Elsevier/Gold Standard (2015-07-28 21:00:42)

## 2017-01-21 NOTE — Progress Notes (Signed)
Patient: Katelyn Baker Female    DOB: Jun 23, 1945   71 y.o.   MRN: [id number] Visit Date: 01/21/2017  Today's Provider: Margaretann LovelessJennifer M Tameaka Eichhorn, PA-C   Chief Complaint  Patient presents with  . Follow-up   Subjective:    HPI Patient here to follow up memory loss last office visit was on 08/18/2016. Patient was advised on starting medication or referral to neurology, pt refused treatment. Patient was advised to start making list and write things down to help her remember things better.  Patient reports that she did not start making her list. Patient is here today with her daughter Katelyn Baker. Patient and daughter report that memory is worse. Patient reports that she is not driving anymore she reports she got lost once driving. Patient's daughter reports that pt got lost driving more than once and reports that pt forgets to eat and brush her teeth.   Patient reports that her bladder is "falling out" patient reports that vesicare has helped with bladder control. Patient's daughter is requesting referral to urology.     No Known Allergies   Current Outpatient Medications:  .  levothyroxine (SYNTHROID, LEVOTHROID) 100 MCG tablet, Take 1 tablet (100 mcg total) by mouth daily., Disp: 90 tablet, Rfl: 1 .  solifenacin (VESICARE) 10 MG tablet, Take 1 tablet (10 mg total) by mouth daily., Disp: 90 tablet, Rfl: 1 .  Calcium Carbonate (CALCIUM 600 PO), Take by mouth., Disp: , Rfl:  .  Cyanocobalamin (RA VITAMIN B-12 TR) 1000 MCG TBCR, Take by mouth., Disp: , Rfl:  .  VITAMIN D, ERGOCALCIFEROL, PO, Take 2,000 Int'l Units by mouth daily. , Disp: , Rfl:   Review of Systems  Constitutional: Negative.   Respiratory: Negative.   Cardiovascular: Negative.   Gastrointestinal: Negative.   Neurological: Negative.        Memory loss  Psychiatric/Behavioral: Positive for decreased concentration.    Social History   Tobacco Use  . Smoking status: Never Smoker  . Smokeless tobacco: Never  Used  Substance Use Topics  . Alcohol use: No   Objective:   BP 110/78 (BP Location: Right Arm, Patient Position: Sitting, Cuff Size: Normal)   Pulse 84   Temp 98.2 F (36.8 C)   Resp 16   Ht 5\' 4"  (1.626 m)   Wt 120 lb (54.4 kg)   BMI 20.60 kg/m  Vitals:   01/21/17 1056  BP: 110/78  Pulse: 84  Resp: 16  Temp: 98.2 F (36.8 C)  Weight: 120 lb (54.4 kg)  Height: 5\' 4"  (1.626 m)     Physical Exam  Constitutional: She appears well-developed and well-nourished. No distress.  Neck: Normal range of motion. Neck supple.  Cardiovascular: Normal rate, regular rhythm and normal heart sounds. Exam reveals no gallop and no friction rub.  No murmur heard. Pulmonary/Chest: Effort normal and breath sounds normal. No respiratory distress. She has no wheezes. She has no rales.  Skin: She is not diaphoretic.  Vitals reviewed.   MMSE - Mini Mental State Exam 01/21/2017  Orientation to time 1  Orientation to Place 5  Registration 3  Attention/ Calculation 5  Recall 2  Language- name 2 objects 2  Language- repeat 1  Language- follow 3 step command 3  Language- read & follow direction 1  Write a sentence 1  Copy design 0  Total score 24      Assessment & Plan:     1. Dementia without behavioral disturbance, unspecified dementia  type Will start donepezil 5mg  as below. She is to call if she has any adverse reaction. I will see her back in 3 months with retesting to see if we can increase donepezil to 10mg .  - donepezil (ARICEPT) 5 MG tablet; Take 1 tablet (5 mg total) by mouth at bedtime.  Dispense: 90 tablet; Refill: 1  2. Female cystocele Patient reports cystocele that is coming out of vaginal opening. She does report she can push it back in, but it eventually starts to fall back out. She denies any bleeding of tissues. Does have urinary incontinence some. Is interested in trying to see if she is a candidate for pessary first before considering surgical intervention if no other  option. Referral placed to GYN as below. Patient prefers female provider so Dr. Valentino Saxonherry pulled for referral.  - Ambulatory referral to Gynecology       Margaretann LovelessJennifer M Joriel Streety, PA-C  [facility]

## 2017-02-24 ENCOUNTER — Ambulatory Visit: Payer: Medicare Other | Admitting: Obstetrics and Gynecology

## 2017-02-24 ENCOUNTER — Encounter: Payer: Self-pay | Admitting: Obstetrics and Gynecology

## 2017-02-24 VITALS — BP 165/80 | HR 63 | Ht 64.0 in | Wt 114.1 lb

## 2017-02-24 DIAGNOSIS — N952 Postmenopausal atrophic vaginitis: Secondary | ICD-10-CM

## 2017-02-24 DIAGNOSIS — N819 Female genital prolapse, unspecified: Secondary | ICD-10-CM

## 2017-02-24 DIAGNOSIS — K5909 Other constipation: Secondary | ICD-10-CM

## 2017-02-24 DIAGNOSIS — N8110 Cystocele, unspecified: Secondary | ICD-10-CM | POA: Diagnosis not present

## 2017-02-24 DIAGNOSIS — R32 Unspecified urinary incontinence: Secondary | ICD-10-CM | POA: Diagnosis not present

## 2017-02-24 NOTE — Patient Instructions (Addendum)
Pelvic Organ Prolapse Pelvic organ prolapse is the stretching, bulging, or dropping of pelvic organs into an abnormal position. It happens when the muscles and tissues that surround and support pelvic structures are stretched or weak. Pelvic organ prolapse can involve:  Vagina (vaginal prolapse).  Uterus (uterine prolapse).  Bladder (cystocele).  Rectum (rectocele).  Intestines (enterocele).  When organs other than the vagina are involved, they often bulge into the vagina or protrude from the vagina, depending on how severe the prolapse is. What are the causes? Causes of this condition include:  Pregnancy, labor, and childbirth.  Long-lasting (chronic) cough.  Chronic constipation.  Obesity.  Past pelvic surgery.  Aging. During and after menopause, a decreased production of the hormone estrogen can weaken pelvic ligaments and muscles.  Consistently lifting more than 50 lb (23 kg).  Buildup of fluid in the abdomen due to certain diseases and other conditions.  What are the signs or symptoms? Symptoms of this condition include:  Loss of bladder control when you cough, sneeze, strain, and exercise (stress incontinence). This may be worse immediately following childbirth, and it may gradually improve over time.  Feeling pressure in your pelvis or vagina. This pressure may increase when you cough or when you are having a bowel movement.  A bulge that protrudes from the opening of your vagina or against your vaginal wall. If your uterus protrudes through the opening of your vagina and rubs against your clothing, you may also experience soreness, ulcers, infection, pain, and bleeding.  Increased effort to have a bowel movement or urinate.  Pain in your low back.  Pain, discomfort, or disinterest in sexual intercourse.  Repeated bladder infections (urinary tract infections).  Difficulty inserting or inability to insert a tampon or applicator.  In some people, this  condition does not cause any symptoms. How is this diagnosed? Your health care provider may perform an internal and external vaginal and rectal exam. During the exam, you may be asked to cough and strain while you are lying down, sitting, and standing up. Your health care provider will determine if other tests are required, such as bladder function tests. How is this treated? In most cases, this condition needs to be treated only if it produces symptoms. No treatment is guaranteed to correct the prolapse or relieve the symptoms completely. Treatment may include:  Lifestyle changes, such as: ? Avoiding drinking beverages that contain caffeine. ? Increasing your intake of high-fiber foods. This can help to decrease constipation and straining during bowel movements. ? Emptying your bladder at scheduled times (bladder training therapy). This can help to reduce or avoid urinary incontinence. ? Losing weight if you are overweight or obese.  Estrogen. Estrogen may help mild prolapse by increasing the strength and tone of pelvic floor muscles.  Kegel exercises. These may help mild cases of prolapse by strengthening and tightening the muscles of the pelvic floor.  Pessary insertion. A pessary is a soft, flexible device that is placed into your vagina by your health care provider to help support the vaginal walls and keep pelvic organs in place.  Surgery. This is often the only form of treatment for severe prolapse. Different types of surgeries are available.  Follow these instructions at home:  Wear a sanitary pad or absorbent product if you have urinary incontinence.  Avoid heavy lifting and straining with exercise and work. Do not hold your breath when you perform mild to moderate lifting and exercise activities. Limit your activities as directed by your health care   provider.  Take medicines only as directed by your health care provider.  Perform Kegel exercises as directed by your health care  provider.  If you have a pessary, take care of it as directed by your health care provider. Contact a health care provider if:  Your symptoms interfere with your daily activities or sex life.  You need medicine to help with the discomfort.  You notice bleeding from the vagina that is not related to your period.  You have a fever.  You have pain or bleeding when you urinate.  You have bleeding when you have a bowel movement.  You lose urine when you have sex.  You have chronic constipation.  You have a pessary that falls out.  You have vaginal discharge that has a bad smell.  You have low abdominal pain or cramping that is unusual for you. This information is not intended to replace advice given to you by your health care provider. Make sure you discuss any questions you have with your health care provider. Document Released: 09/05/2013 Document Revised: 07/17/2015 Document Reviewed: 04/23/2013 Elsevier Interactive Patient Education  2018 [employer]vier Inc.  

## 2017-02-24 NOTE — Progress Notes (Signed)
GYNECOLOGY CLINIC PROGRESS NOTE  Subjective:    Katelyn Baker is a 72 y.o. G57P2002 female who presents for evaluation of a cystocele. She was referred from [facility].  Problem started approximately 15 years ago. Symptoms include: urinary incontinence: mild and discomfort: mild (notes she feels a bulge). Symptoms have gradually worsened.  She notes that she has also had several days of constipation recently. Usually does not suffer from constipation.  Urinary incontinence is usually after coughing, sneezing. Patient states that she discussed options with her PCP and would like to look into trying a pessary.   Menstrual History:  No LMP recorded. Patient has had a hysterectomy.      Past Medical History:  Diagnosis Date  . GERD (gastroesophageal reflux disease)   . Hyperlipidemia   . Thyroid disease     Family History  Problem Relation Age of Onset  . Alzheimer's disease Mother   . Diabetes Mother        ?  Marland Kitchen Alcohol abuse Father   . Cancer Father        unknown cancer  . Diabetes Other   . Heart disease Other   . Cancer Other   . Colon polyps Other   . Breast cancer Neg Hx     Past Surgical History:  Procedure Laterality Date  . ABDOMINAL HYSTERECTOMY     partial  . BREAST BIOPSY Left 1980'S   EXCISIONAL - NEG  . BREAST SURGERY  2000's   biopsy  . THYROIDECTOMY, PARTIAL  1990's    Social History   Socioeconomic History  . Marital status: Married    Spouse name: Jonny Ruiz  . Number of children: 2  . Years of education: 63  . Highest education level: Not on file  Social Needs  . Financial resource strain: Not on file  . Food insecurity - worry: Not on file  . Food insecurity - inability: Not on file  . Transportation needs - medical: Not on file  . Transportation needs - non-medical: Not on file  Occupational History  . Occupation: Retired  Tobacco Use  . Smoking status: Passive Smoke Exposure - Never Smoker  . Smokeless tobacco: Never  Used  Substance and Sexual Activity  . Alcohol use: No  . Drug use: No  . Sexual activity: Not on file  Other Topics Concern  . Not on file  Social History Narrative  . Not on file    Current Outpatient Medications on File Prior to Visit  Medication Sig Dispense Refill  . Calcium Carbonate (CALCIUM 600 PO) Take by mouth.    . Cyanocobalamin (RA VITAMIN B-12 TR) 1000 MCG TBCR Take by mouth.    . donepezil (ARICEPT) 5 MG tablet Take 1 tablet (5 mg total) by mouth at bedtime. 90 tablet 1  . levothyroxine (SYNTHROID, LEVOTHROID) 100 MCG tablet Take 1 tablet (100 mcg total) by mouth daily. 90 tablet 1  . solifenacin (VESICARE) 10 MG tablet Take 1 tablet (10 mg total) by mouth daily. 90 tablet 1  . VITAMIN D, ERGOCALCIFEROL, PO Take 2,000 Int'l Units by mouth daily.      No current facility-administered medications on file prior to visit.     No Known Allergies   Review of Systems Pertinent items noted in HPI and remainder of comprehensive ROS otherwise negative.   Objective:     BP (!) 165/80 (BP Location: Left Arm, Patient Position: Sitting, Cuff Size: Normal)   Pulse 63   Ht  5\' 4"  (1.626 m)   Wt 114 lb 1.6 oz (51.8 kg)   BMI 19.59 kg/m  General appearance: alert and no distress Abdomen: soft, non-tender; bowel sounds normal; no masses,  no organomegaly Pelvic:   External genitalia: normal general appearance  Urinary system: urethral meatus normal and bladder not palpable. No leakage of urine on valsalva.  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 Constipation  Plan:    Discussed cystoceles/rectoceles and management options with the patient. Pessary fitting performed today.  Will order Size 4 ring with support.    Vaginal atrophy - given samples of Premarin cream for pre-treatment.  Constipation - patient notes that she has tried several OTC regimens including Miralax. Advised on Magnesium Citrate.  Urinary incontinence, to be treated with pessary.  Currently on Vesicare for incontinence, although this is known to help more with urge incontinence, which patient today currently denies.  RTC in 2 weeks for insertion of pessary.    Hildred Laser.Chara Marquard, MD [facility] 02/26/2017 4:50 PM

## 2017-02-26 ENCOUNTER — Encounter: Payer: Self-pay | Admitting: Obstetrics and Gynecology

## 2017-03-14 ENCOUNTER — Encounter: Payer: Self-pay | Admitting: Obstetrics and Gynecology

## 2017-03-14 ENCOUNTER — Ambulatory Visit: Payer: Medicare Other | Admitting: Obstetrics and Gynecology

## 2017-03-14 VITALS — BP 171/80 | HR 52 | Ht 64.0 in | Wt 120.6 lb

## 2017-03-14 DIAGNOSIS — N393 Stress incontinence (female) (male): Secondary | ICD-10-CM

## 2017-03-14 DIAGNOSIS — Z4689 Encounter for fitting and adjustment of other specified devices: Secondary | ICD-10-CM | POA: Diagnosis not present

## 2017-03-14 DIAGNOSIS — N952 Postmenopausal atrophic vaginitis: Secondary | ICD-10-CM

## 2017-03-14 DIAGNOSIS — N819 Female genital prolapse, unspecified: Secondary | ICD-10-CM

## 2017-03-14 DIAGNOSIS — N8110 Cystocele, unspecified: Secondary | ICD-10-CM

## 2017-03-14 MED ORDER — ESTROGENS, CONJUGATED 0.625 MG/GM VA CREA
1.0000 | TOPICAL_CREAM | VAGINAL | 4 refills | Status: DC
Start: 2017-03-14 — End: 2017-04-29

## 2017-03-14 NOTE — Progress Notes (Signed)
    GYNECOLOGY PROGRESS NOTE  Subjective:    Patient ID: Boyce MediciNancy J Laban, female    DOB: Jun 28, 1945, 72 y.o.   MRN: [id number]  HPI  Patient is a 72 y.o. G2P2 female who presents for pessary insertion today. She has a h/o vaginal vault prolapse with cystocele, post-hysterectomy and mild stress incontinence.  Also with vaginal atrophy.   The following portions of the patient's history were reviewed and updated as appropriate: allergies, current medications, past family history, past medical history, past social history, past surgical history and problem list.  Review of Systems Pertinent items noted in HPI and remainder of comprehensive ROS otherwise negative.   Objective:   Blood pressure (!) 171/80, pulse (!) 52, height 5\' 4"  (1.626 m), weight 120 lb 9.6 oz (54.7 kg). General appearance: alert and no distress Abdomen: soft, non-tender; bowel sounds normal; no masses,  no organomegaly  Pelvic:              External genitalia: normal general appearance             Urinary system: urethral meatus normal and bladder not palpable. No leakage of urine on valsalva.             Vaginal: atrophic mucosa and vaginal vault, prolapsed, Grade 3, with Grade 2 cystocele             Cervix: removed surgically             Adnexa: non palpable, no tenderness             Uterus: removed surgically             Rectal: good sphincter tone and large amount of stool in rectal vault   Assessment:   Pessary insertion Vaginal vault prolapse Cystocele Vaginal atrophy Mild stress urinary incontinence  Plan:   - Size 4 ring with support placed today. Prescribed Premarin cream for pessary maintenance and for management of vaginal atrophy.  - Constipation still present, with large amount of stool in vault.  Has tried several OTC remigens. Patient may need to consider fleet enema. Advised that straining with constipation can result in dislodging of the pessary.  - Mild urinary incontinence (stress),  currently on Vesicare. Hopefully pessary can also offer some relief.  - RTC in 2-3 weeks to f/u symptoms.    Hildred Laserherry, Parish Dubose, MD [facility]

## 2017-03-14 NOTE — Patient Instructions (Addendum)
Pelvic Organ Prolapse Pelvic organ prolapse is the stretching, bulging, or dropping of pelvic organs into an abnormal position. It happens when the muscles and tissues that surround and support pelvic structures are stretched or weak. Pelvic organ prolapse can involve:  Vagina (vaginal prolapse).  Uterus (uterine prolapse).  Bladder (cystocele).  Rectum (rectocele).  Intestines (enterocele).  When organs other than the vagina are involved, they often bulge into the vagina or protrude from the vagina, depending on how severe the prolapse is. What are the causes? Causes of this condition include:  Pregnancy, labor, and childbirth.  Long-lasting (chronic) cough.  Chronic constipation.  Obesity.  Past pelvic surgery.  Aging. During and after menopause, a decreased production of the hormone estrogen can weaken pelvic ligaments and muscles.  Consistently lifting more than 50 lb (23 kg).  Buildup of fluid in the abdomen due to certain diseases and other conditions.  What are the signs or symptoms? Symptoms of this condition include:  Loss of bladder control when you cough, sneeze, strain, and exercise (stress incontinence). This may be worse immediately following childbirth, and it may gradually improve over time.  Feeling pressure in your pelvis or vagina. This pressure may increase when you cough or when you are having a bowel movement.  A bulge that protrudes from the opening of your vagina or against your vaginal wall. If your uterus protrudes through the opening of your vagina and rubs against your clothing, you may also experience soreness, ulcers, infection, pain, and bleeding.  Increased effort to have a bowel movement or urinate.  Pain in your low back.  Pain, discomfort, or disinterest in sexual intercourse.  Repeated bladder infections (urinary tract infections).  Difficulty inserting or inability to insert a tampon or applicator.  In some people, this  condition does not cause any symptoms. How is this diagnosed? Your health care provider may perform an internal and external vaginal and rectal exam. During the exam, you may be asked to cough and strain while you are lying down, sitting, and standing up. Your health care provider will determine if other tests are required, such as bladder function tests. How is this treated? In most cases, this condition needs to be treated only if it produces symptoms. No treatment is guaranteed to correct the prolapse or relieve the symptoms completely. Treatment may include:  Lifestyle changes, such as: ? Avoiding drinking beverages that contain caffeine. ? Increasing your intake of high-fiber foods. This can help to decrease constipation and straining during bowel movements. ? Emptying your bladder at scheduled times (bladder training therapy). This can help to reduce or avoid urinary incontinence. ? Losing weight if you are overweight or obese.  Estrogen. Estrogen may help mild prolapse by increasing the strength and tone of pelvic floor muscles.  Kegel exercises. These may help mild cases of prolapse by strengthening and tightening the muscles of the pelvic floor.  Pessary insertion. A pessary is a soft, flexible device that is placed into your vagina by your health care provider to help support the vaginal walls and keep pelvic organs in place.  Surgery. This is often the only form of treatment for severe prolapse. Different types of surgeries are available.  Follow these instructions at home:  Wear a sanitary pad or absorbent product if you have urinary incontinence.  Avoid heavy lifting and straining with exercise and work. Do not hold your breath when you perform mild to moderate lifting and exercise activities. Limit your activities as directed by your health care   provider.  Take medicines only as directed by your health care provider.  Perform Kegel exercises as directed by your health care  provider.  If you have a pessary, take care of it as directed by your health care provider. Contact a health care provider if:  Your symptoms interfere with your daily activities or sex life.  You need medicine to help with the discomfort.  You notice bleeding from the vagina that is not related to your period.  You have a fever.  You have pain or bleeding when you urinate.  You have bleeding when you have a bowel movement.  You lose urine when you have sex.  You have chronic constipation.  You have a pessary that falls out.  You have vaginal discharge that has a bad smell.  You have low abdominal pain or cramping that is unusual for you. This information is not intended to replace advice given to you by your health care provider. Make sure you discuss any questions you have with your health care provider. Document Released: 09/05/2013 Document Revised: 07/17/2015 Document Reviewed: 04/23/2013 Elsevier Interactive Patient Education  2018 [employer]vier Inc.  

## 2017-04-04 ENCOUNTER — Telehealth: Payer: Self-pay | Admitting: Obstetrics and Gynecology

## 2017-04-04 NOTE — Telephone Encounter (Signed)
The patient called and stated that she would like to her Dr. Valentino Saxonherry or her nurse give her a call back in regards to her having some bladder issues. No other information was disclosed. Please advise.

## 2017-04-05 ENCOUNTER — Encounter: Payer: Medicare Other | Admitting: Obstetrics and Gynecology

## 2017-04-07 NOTE — Telephone Encounter (Signed)
Pt stated that she removed urine ring and it made it feel better and she haven't had any problems since then.

## 2017-04-29 ENCOUNTER — Ambulatory Visit (INDEPENDENT_AMBULATORY_CARE_PROVIDER_SITE_OTHER): Payer: Medicare Other | Admitting: Physician Assistant

## 2017-04-29 ENCOUNTER — Encounter: Payer: Self-pay | Admitting: Physician Assistant

## 2017-04-29 VITALS — BP 130/80 | HR 68 | Temp 97.8°F | Resp 16 | Wt 119.0 lb

## 2017-04-29 DIAGNOSIS — R7309 Other abnormal glucose: Secondary | ICD-10-CM

## 2017-04-29 DIAGNOSIS — J069 Acute upper respiratory infection, unspecified: Secondary | ICD-10-CM

## 2017-04-29 DIAGNOSIS — F0280 Dementia in other diseases classified elsewhere without behavioral disturbance: Secondary | ICD-10-CM

## 2017-04-29 DIAGNOSIS — G301 Alzheimer's disease with late onset: Secondary | ICD-10-CM

## 2017-04-29 DIAGNOSIS — F0281 Dementia in other diseases classified elsewhere with behavioral disturbance: Secondary | ICD-10-CM | POA: Insufficient documentation

## 2017-04-29 DIAGNOSIS — E559 Vitamin D deficiency, unspecified: Secondary | ICD-10-CM

## 2017-04-29 DIAGNOSIS — E039 Hypothyroidism, unspecified: Secondary | ICD-10-CM | POA: Diagnosis not present

## 2017-04-29 DIAGNOSIS — E7800 Pure hypercholesterolemia, unspecified: Secondary | ICD-10-CM

## 2017-04-29 DIAGNOSIS — R7989 Other specified abnormal findings of blood chemistry: Secondary | ICD-10-CM

## 2017-04-29 DIAGNOSIS — E441 Mild protein-calorie malnutrition: Secondary | ICD-10-CM

## 2017-04-29 DIAGNOSIS — B9789 Other viral agents as the cause of diseases classified elsewhere: Secondary | ICD-10-CM

## 2017-04-29 DIAGNOSIS — R945 Abnormal results of liver function studies: Secondary | ICD-10-CM | POA: Diagnosis not present

## 2017-04-29 DIAGNOSIS — F02818 Dementia in other diseases classified elsewhere, unspecified severity, with other behavioral disturbance: Secondary | ICD-10-CM | POA: Insufficient documentation

## 2017-04-29 NOTE — Patient Instructions (Addendum)
Add protein shake 1 can between meals daily if tolerated (Boost, Ensure, etc)  Memory Compensation Strategies  1. Use "WARM" strategy.  W= write it down  A= associate it  R= repeat it  M= make a mental note  2.   You can keep a Glass blower/designer.  Use a 3-ring notebook with sections for the following: calendar, important names and phone numbers,  medications, doctors' names/phone numbers, lists/reminders, and a section to journal what you did  each day.   3.    Use a calendar to write appointments down.  4.    Write yourself a schedule for the day.  This can be placed on the calendar or in a separate section of the Memory Notebook.  Keeping a  regular schedule can help memory.  5.    Use medication organizer with sections for each day or morning/evening pills.  You may need help loading it  6.    Keep a basket, or pegboard by the door.  Place items that you need to take out with you in the basket or on the pegboard.  You may also want to  include a message board for reminders.  7.    Use sticky notes.  Place sticky notes with reminders in a place where the task is performed.  For example: " turn off the  stove" placed by the stove, "lock the door" placed on the door at eye level, " take your medications" on  the bathroom mirror or by the place where you normally take your medications.  8.    Use alarms/timers.  Use while cooking to remind yourself to check on food or as a reminder to take your medicine, or as a  reminder to make a call, or as a reminder to perform another task, etc.  Malnutrition Malnutrition is any condition in which nutrition is poor. There are many forms of malnutrition. A common form is having too little of one kind of nutrient (nutritional deficiency). Nutrients include proteins, minerals, carbohydrates, fats, and vitamins. They provide the body with energy and keep the body working normally. Malnutrition ranges from mild to severe. The condition affects the body's  defense system (immune system). Because of this, people who are malnourished are more likely to develop health problems and get sick. What are the causes? Causes of malnutrition include:  Eating an unbalanced diet.  Eating too much of certain foods.  Eating too little.  Conditions that decrease the body's ability to use nutrients.  What increases the risk? Risk factors include:  Pregnancy and lactation. Women who are pregnant may become malnourished if they do not increase their nutrient intake. They are also susceptible to folic acid deficiency.  Increasing age. The body's ability to absorb nutrients decreases with age. This can contribute to iron, calcium, and vitamin D deficiencies.  Alcohol or drug dependency. Addiction often leads to a lifestyle in which proper nourishment is ignored. Dependency can also hurt the metabolism and the body's ability to absorb nutrients. Alcoholism is a major cause of thiamine deficiency and can lead to deficiencies of magnesium, zinc, and other vitamins.  Eating disorders, such as anorexia nervosa. People with these disorders may eat too little or too much.  Chewing or swallowing problems. People with these disorders may not eat enough.  Certain diseases, including: ? Long-lasting (chronic) diseases. Chronic diseases tend to affect the absorption of calcium, iron, and vitamins B12, A, D, E, and K. ? Liver disease. Liver disease affects the storage of  vitamins A and B12. It also interferes with the metabolism of protein and energy sources. ? Kidney disease. Kidney disease may cause deficiencies of protein, iron, and vitamin D. ? Cancer or AIDS. These diseases can cause a loss of appetite. ? Cystic fibrosis. This disease can make it difficult for the body to absorb nutrients.  Certain diets, including. ? The vegetarian diet. Vegetarians are at risk for iron deficiency. ? The vegan diet. Vegans are susceptible to vitamin B12, calcium, iron, vitamin  D, and zinc deficiencies. ? The fruitarian diet. This diet can be deficient in protein, sodium, and many micronutrients. ? Many commercial "fad" diets, including those that claim to enhance well-being and reduce weight. ? Very low calorie diets.  Low income. People with a low income may have trouble paying for nutritious foods.  What are the signs or symptoms? Signs and symptoms depend on the kind of malnutrition you have. Common symptoms include:  Fatigue.  Weakness.  Dizziness.  Fainting  Weight loss.  Poor immune response.  Lack of menstruation.  Hair loss.  Poor memory.  How is this diagnosed? Malnutrition may be diagnosed by:  A medical history.  A dietary history.  A physical exam. This may include a measurement of your body mass index (BMI).  Blood tests.  How is this treated? Treatments vary depending on the cause of the malnutrition. Common treatments include:  Dietary changes.  Dietary supplements, such as vitamins and minerals.  Treatment of any underlying conditions.  Follow these instructions at home:  Eat a balanced diet.  Take dietary supplements as directed by your health care provider.  Exercise regularly. Exercising can improve appetite.  Keep all follow-up visits as directed by your health care provider. This is important. How is this prevented? Eating a well-balanced diet helps to prevent most forms of malnutrition. Contact a health care provider if:  You have increased weakness or fatigue.  You faint.  You stop menstruating.  You have rapid hair loss.  You have unexpected weight loss. This information is not intended to replace advice given to you by your health care provider. Make sure you discuss any questions you have with your health care provider. Document Released: 12/25/2004 Document Revised: 07/17/2015 Document Reviewed: 10/05/2013 Elsevier Interactive Patient Education  [employer]2018 [employer] Inc.

## 2017-04-29 NOTE — Progress Notes (Signed)
Patient: Katelyn Baker Female    DOB: 1945-07-15   72 y.o.   MRN: [id number] Visit Date: 04/29/2017  Today's Provider: Margaretann LovelessJennifer M Burnette, PA-C   Chief Complaint  Patient presents with  . Dementia  . URI   Subjective:    HPI Dementia-  Last office visit 01/21/17. She was started on Donezepil 5 mg. She reports that she is doing ok on the medication. No side effects.   Pt reports that she has been coughing for about a week. It started with hoarseness in her voice. She reports that she does not feel that bad her voice is just bothering her. No fevers, chills, shortness of breath. She coughs brownish color sputum up every now and then. She reports that she has post nasal drainage and can feel congestion in her chest when she breathes.      No Known Allergies   Current Outpatient Medications:  .  Calcium Carbonate (CALCIUM 600 PO), Take by mouth., Disp: , Rfl:  .  donepezil (ARICEPT) 5 MG tablet, Take 1 tablet (5 mg total) by mouth at bedtime., Disp: 90 tablet, Rfl: 1 .  levothyroxine (SYNTHROID, LEVOTHROID) 100 MCG tablet, Take 1 tablet (100 mcg total) by mouth daily., Disp: 90 tablet, Rfl: 1 .  Multiple Vitamin (MULTIVITAMIN) tablet, Take 1 tablet by mouth daily., Disp: , Rfl:  .  solifenacin (VESICARE) 10 MG tablet, Take 1 tablet (10 mg total) by mouth daily., Disp: 90 tablet, Rfl: 1 .  VITAMIN D, ERGOCALCIFEROL, PO, Take 2,000 Int'l Units by mouth daily. , Disp: , Rfl:  .  conjugated estrogens (PREMARIN) vaginal cream, Place 1 Applicatorful vaginally 2 (two) times a week. Use 1/2 gram twice weekly, vaginally. (Patient not taking: Reported on 04/29/2017), Disp: 42.5 g, Rfl: 4  Review of Systems  Constitutional: Positive for fatigue.  HENT: Positive for congestion, postnasal drip, rhinorrhea and voice change.   Eyes: Negative.   Respiratory: Positive for cough.   Cardiovascular: Negative.   Gastrointestinal: Negative.   Endocrine: Negative.   Genitourinary: Negative.     Musculoskeletal: Negative.   Skin: Negative.   Allergic/Immunologic: Negative.   Neurological: Negative.   Hematological: Negative.   Psychiatric/Behavioral: Negative.     Social History   Tobacco Use  . Smoking status: Passive Smoke Exposure - Never Smoker  . Smokeless tobacco: Never Used  Substance Use Topics  . Alcohol use: No   Objective:   BP 130/80 (BP Location: Left Arm, Patient Position: Sitting, Cuff Size: Normal)   Pulse 68   Temp 97.8 F (36.6 C) (Oral)   Resp 16   Wt 119 lb (54 kg)   BMI 20.43 kg/m  Vitals:   04/29/17 1120  BP: 130/80  Pulse: 68  Resp: 16  Temp: 97.8 F (36.6 C)  TempSrc: Oral  Weight: 119 lb (54 kg)     Physical Exam  Constitutional: She appears well-developed and well-nourished. No distress.  HENT:  Head: Normocephalic and atraumatic.  Right Ear: Hearing, tympanic membrane, external ear and ear canal normal.  Left Ear: Hearing, tympanic membrane, external ear and ear canal normal.  Nose: Nose normal.  Mouth/Throat: Uvula is midline, oropharynx is clear and moist and mucous membranes are normal. No oropharyngeal exudate.  Eyes: Conjunctivae are normal. Pupils are equal, round, and reactive to light. Right eye exhibits no discharge. Left eye exhibits no discharge. No scleral icterus.  Neck: Normal range of motion. Neck supple. No tracheal deviation present. No thyromegaly present.  Cardiovascular: Normal rate, regular rhythm and normal heart sounds. Exam reveals no gallop and no friction rub.  No murmur heard. Pulmonary/Chest: Effort normal and breath sounds normal. No stridor. No respiratory distress. She has no wheezes. She has no rales.  Lymphadenopathy:    She has no cervical adenopathy.  Skin: Skin is warm and dry. She is not diaphoretic.  Psychiatric: She has a normal mood and affect. Her behavior is normal. Judgment and thought content normal.  Vitals reviewed.   MMSE - Mini Mental State Exam 04/29/2017 01/21/2017   Orientation to time 2 1  Orientation to Place 4 5  Registration 3 3  Attention/ Calculation 5 5  Recall 2 2  Language- name 2 objects 2 2  Language- repeat 1 1  Language- follow 3 step command 3 3  Language- read & follow direction 1 1  Write a sentence 1 1  Copy design 1 0  Total score 25 24       Assessment & Plan:     1. Late onset Alzheimer's disease without behavioral disturbance Still having some memory issues, like today in office could not remember what Dr. Valentino Saxon did for her and thought we were the same office even though MMSE is stable. Patient does not drive. Will continue Donepezil 5mg  at this time (patient having some daytime drowsiness at this dose). I will see her back in 3 months for CPE.   2. Adult hypothyroidism Stable on levothyroxine . Will check labs as below and f/u pending results. - CBC w/Diff/Platelet - Comprehensive Metabolic Panel (CMET) - TSH  3. Abnormal blood sugar Diet controlled. Will check labs as below and f/u pending results. - CBC w/Diff/Platelet - Comprehensive Metabolic Panel (CMET)  4. Hypercholesteremia Diet controlled. Will check labs as below and f/u pending results. - CBC w/Diff/Platelet - Comprehensive Metabolic Panel (CMET) - Lipid Profile  5. Abnormal LFTs H/O this. Will check labs as below and f/u pending results. - Comprehensive Metabolic Panel (CMET) - Lipid Profile  6. Avitaminosis D On 2000 IU daily. Will check labs as below and f/u pending results. - CBC w/Diff/Platelet - Vitamin D (25 hydroxy)  7. Mild protein-calorie malnutrition (HCC) Weight stable at 119. Will check labs as below and f/u pending results. Advised to add protein shakes (1 to 3 cans per day).  - CBC w/Diff/Platelet - Comprehensive Metabolic Panel (CMET) - Lipid Profile - Vitamin D (25 hydroxy)  8. Viral URI with cough Improving.        [name], PA-C  [facility]

## 2017-04-30 LAB — COMPREHENSIVE METABOLIC PANEL
ALT: 12 IU/L (ref 0–32)
AST: 14 IU/L (ref 0–40)
Albumin/Globulin Ratio: 1.6 (ref 1.2–2.2)
Albumin: 4.2 g/dL (ref 3.5–4.8)
Alkaline Phosphatase: 58 IU/L (ref 39–117)
BUN/Creatinine Ratio: 11 — ABNORMAL LOW (ref 12–28)
BUN: 9 mg/dL (ref 8–27)
Bilirubin Total: 0.6 mg/dL (ref 0.0–1.2)
CO2: 25 mmol/L (ref 20–29)
Calcium: 9.5 mg/dL (ref 8.7–10.3)
Chloride: 102 mmol/L (ref 96–106)
Creatinine, Ser: 0.79 mg/dL (ref 0.57–1.00)
GFR calc Af Amer: 88 mL/min/{1.73_m2} (ref >59)
GFR calc non Af Amer: 76 mL/min/{1.73_m2} (ref >59)
Globulin, Total: 2.6 g/dL (ref 1.5–4.5)
Glucose: 90 mg/dL (ref 65–99)
Potassium: 4.3 mmol/L (ref 3.5–5.2)
Sodium: 142 mmol/L (ref 134–144)
Total Protein: 6.8 g/dL (ref 6.0–8.5)

## 2017-04-30 LAB — CBC WITH DIFFERENTIAL/PLATELET
Basophils Absolute: 0 10*3/uL (ref 0.0–0.2)
Basos: 0 %
EOS (ABSOLUTE): 0.1 10*3/uL (ref 0.0–0.4)
Eos: 3 %
Hematocrit: 39.7 % (ref 34.0–46.6)
Hemoglobin: 13.5 g/dL (ref 11.1–15.9)
Immature Grans (Abs): 0 10*3/uL (ref 0.0–0.1)
Immature Granulocytes: 0 %
Lymphocytes Absolute: 0.7 10*3/uL (ref 0.7–3.1)
Lymphs: 14 %
MCH: 31.2 pg (ref 26.6–33.0)
MCHC: 34 g/dL (ref 31.5–35.7)
MCV: 92 fL (ref 79–97)
Monocytes Absolute: 0.7 10*3/uL (ref 0.1–0.9)
Monocytes: 14 %
Neutrophils Absolute: 3.4 10*3/uL (ref 1.4–7.0)
Neutrophils: 69 %
Platelets: 197 10*3/uL (ref 150–379)
RBC: 4.33 x10E6/uL (ref 3.77–5.28)
RDW: 13.1 % (ref 12.3–15.4)
WBC: 5 10*3/uL (ref 3.4–10.8)

## 2017-04-30 LAB — VITAMIN D 25 HYDROXY (VIT D DEFICIENCY, FRACTURES): Vit D, 25-Hydroxy: 33.1 ng/mL (ref 30.0–100.0)

## 2017-04-30 LAB — LIPID PANEL
Chol/HDL Ratio: 4.5 ratio — ABNORMAL HIGH (ref 0.0–4.4)
Cholesterol, Total: 187 mg/dL (ref 100–199)
HDL: 42 mg/dL (ref >39)
LDL Calculated: 115 mg/dL — ABNORMAL HIGH (ref 0–99)
Triglycerides: 152 mg/dL — ABNORMAL HIGH (ref 0–149)
VLDL Cholesterol Cal: 30 mg/dL (ref 5–40)

## 2017-04-30 LAB — TSH: TSH: 1.23 u[IU]/mL (ref 0.450–4.500)

## 2017-05-15 ENCOUNTER — Other Ambulatory Visit: Payer: Self-pay | Admitting: Physician Assistant

## 2017-05-15 DIAGNOSIS — F0390 Unspecified dementia without behavioral disturbance: Secondary | ICD-10-CM

## 2017-05-15 DIAGNOSIS — N3941 Urge incontinence: Secondary | ICD-10-CM

## 2017-05-15 DIAGNOSIS — E890 Postprocedural hypothyroidism: Secondary | ICD-10-CM

## 2017-05-27 ENCOUNTER — Ambulatory Visit: Payer: Medicare Other | Admitting: Physician Assistant

## 2017-05-27 ENCOUNTER — Encounter: Payer: Self-pay | Admitting: Physician Assistant

## 2017-05-27 VITALS — BP 126/72 | Temp 98.5°F | Resp 16 | Ht 65.0 in | Wt 121.0 lb

## 2017-05-27 DIAGNOSIS — J069 Acute upper respiratory infection, unspecified: Secondary | ICD-10-CM | POA: Diagnosis not present

## 2017-05-27 DIAGNOSIS — R05 Cough: Secondary | ICD-10-CM

## 2017-05-27 DIAGNOSIS — R059 Cough, unspecified: Secondary | ICD-10-CM

## 2017-05-27 MED ORDER — AMOXICILLIN-POT CLAVULANATE 875-125 MG PO TABS: 1.0000 | ORAL_TABLET | Freq: Two times a day (BID) | ORAL | 0 refills | Status: DC

## 2017-05-27 MED ORDER — PSEUDOEPH-BROMPHEN-DM 30-2-10 MG/5ML PO SYRP: 5.0000 mL | ORAL_SOLUTION | Freq: Four times a day (QID) | ORAL | 0 refills | Status: DC | PRN

## 2017-05-27 NOTE — Progress Notes (Signed)
Patient: Katelyn Baker Female    DOB: 01-01-1946   72 y.o.   MRN: [id number] Visit Date: 05/27/2017  Today's Provider: [name], PA-C   Chief Complaint  Patient presents with  . Cough   Subjective:    Cough  This is a new problem. The current episode started 1 to 4 weeks ago (about 3 weeks). The problem has been unchanged. The cough is productive of sputum. Associated symptoms include a fever, headaches, nasal congestion, postnasal drip, rhinorrhea and a sore throat. Pertinent negatives include no ear pain or shortness of breath. She has tried OTC cough suppressant and rest for the symptoms. The treatment provided mild relief. There is no history of asthma, COPD or environmental allergies.      No Known Allergies   Current Outpatient Medications:  .  Calcium Carbonate (CALCIUM 600 PO), Take by mouth., Disp: , Rfl:  .  donepezil (ARICEPT) 5 MG tablet, TAKE 1 TABLET BY MOUTH AT BEDTIME, Disp: 90 tablet, Rfl: 1 .  levothyroxine (SYNTHROID, LEVOTHROID) 100 MCG tablet, TAKE 1 TABLET BY MOUTH ONCE DAILY, Disp: 90 tablet, Rfl: 1 .  Multiple Vitamin (MULTIVITAMIN) tablet, Take 1 tablet by mouth daily., Disp: , Rfl:  .  VESICARE 10 MG tablet, TAKE 1 TABLET BY MOUTH ONCE DAILY, Disp: 90 tablet, Rfl: 1 .  VITAMIN D, ERGOCALCIFEROL, PO, Take 2,000 Int'l Units by mouth daily. , Disp: , Rfl:   Review of Systems  Constitutional: Positive for fever.  HENT: Positive for postnasal drip, rhinorrhea and sore throat. Negative for ear pain, sinus pressure and sinus pain.   Respiratory: Positive for cough. Negative for chest tightness and shortness of breath.   Cardiovascular: Negative.   Gastrointestinal: Negative.   Allergic/Immunologic: Negative for environmental allergies.  Neurological: Positive for headaches. Negative for dizziness and light-headedness.    Social History   Tobacco Use  . Smoking status: Passive Smoke Exposure - Never Smoker  . Smokeless tobacco: Never  Used  Substance Use Topics  . Alcohol use: No   Objective:   BP 126/72 (BP Location: Right Arm, Patient Position: Sitting, Cuff Size: Normal)   Temp 98.5 F (36.9 C)   Resp 16   Ht 5\' 5"  (1.651 m)   Wt 121 lb (54.9 kg)   BMI 20.14 kg/m  Vitals:   05/27/17 0937  BP: 126/72  Resp: 16  Temp: 98.5 F (36.9 C)  Weight: 121 lb (54.9 kg)  Height: 5\' 5"  (1.651 m)     Physical Exam  Constitutional: She appears well-developed and well-nourished. No distress.  HENT:  Head: Normocephalic and atraumatic.  Right Ear: Hearing, external ear and ear canal normal. A middle ear effusion (clear fluid with small chain air bubbles noted from 6-8 o clock window) is present.  Left Ear: Hearing, tympanic membrane, external ear and ear canal normal.  Nose: Mucosal edema present. Right sinus exhibits no maxillary sinus tenderness and no frontal sinus tenderness. Left sinus exhibits no maxillary sinus tenderness and no frontal sinus tenderness.  Mouth/Throat: Uvula is midline, oropharynx is clear and moist and mucous membranes are normal. No oropharyngeal exudate, posterior oropharyngeal edema or posterior oropharyngeal erythema.  Eyes: Pupils are equal, round, and reactive to light. Conjunctivae are normal. Right eye exhibits no discharge. Left eye exhibits no discharge. No scleral icterus.  Neck: Normal range of motion. Neck supple. No tracheal deviation present. No thyromegaly present.  Cardiovascular: Normal rate, regular rhythm and normal heart sounds. Exam reveals no  gallop and no friction rub.  No murmur heard. Pulmonary/Chest: Effort normal and breath sounds normal. No stridor. No respiratory distress. She has no wheezes. She has no rales.  Lymphadenopathy:    She has no cervical adenopathy.  Skin: Skin is warm and dry. She is not diaphoretic.  Vitals reviewed.     Assessment & Plan:     1. Upper respiratory tract infection, unspecified type Worsening symptoms that have not responded to OTC  medications. Will give augmentin as below. Continue allergy medications. Stay well hydrated and get plenty of rest. Call if no symptom improvement or if symptoms worsen. - amoxicillin-clavulanate (AUGMENTIN) 875-125 MG tablet; Take 1 tablet by mouth 2 (two) times daily.  Dispense: 20 tablet; Refill: 0  2. Cough  - brompheniramine-pseudoephedrine-DM 30-2-10 MG/5ML syrup; Take 5 mLs by mouth 4 (four) times daily as needed.  Dispense: 120 mL; Refill: 0       Margaretann LovelessJennifer M Burnette, PA-C  [facility]

## 2017-05-27 NOTE — Patient Instructions (Signed)
Upper Respiratory Infection, Adult Most upper respiratory infections (URIs) are caused by a virus. A URI affects the nose, throat, and upper air passages. The most common type of URI is often called "the common cold." Follow these instructions at home:  Take medicines only as told by your doctor.  Gargle warm saltwater or take cough drops to comfort your throat as told by your doctor.  Use a warm mist humidifier or inhale steam from a shower to increase air moisture. This may make it easier to breathe.  Drink enough fluid to keep your pee (urine) clear or pale yellow.  Eat soups and other clear broths.  Have a healthy diet.  Rest as needed.  Go back to work when your fever is gone or your doctor says it is okay. ? You may need to stay home longer to avoid giving your URI to others. ? You can also wear a face mask and wash your hands often to prevent spread of the virus.  Use your inhaler more if you have asthma.  Do not use any tobacco products, including cigarettes, chewing tobacco, or electronic cigarettes. If you need help quitting, ask your doctor. Contact a doctor if:  You are getting worse, not better.  Your symptoms are not helped by medicine.  You have chills.  You are getting more short of breath.  You have brown or red mucus.  You have yellow or brown discharge from your nose.  You have pain in your face, especially when you bend forward.  You have a fever.  You have puffy (swollen) neck glands.  You have pain while swallowing.  You have white areas in the back of your throat. Get help right away if:  You have very bad or constant: ? Headache. ? Ear pain. ? Pain in your forehead, behind your eyes, and over your cheekbones (sinus pain). ? Chest pain.  You have long-lasting (chronic) lung disease and any of the following: ? Wheezing. ? Long-lasting cough. ? Coughing up blood. ? A change in your usual mucus.  You have a stiff neck.  You have  changes in your: ? Vision. ? Hearing. ? Thinking. ? Mood. This information is not intended to replace advice given to you by your health care provider. Make sure you discuss any questions you have with your health care provider. Document Released: 07/28/2007 Document Revised: 10/12/2015 Document Reviewed: 05/16/2013 Elsevier Interactive Patient Education  2018 Elsevier Inc.  

## 2017-06-01 ENCOUNTER — Telehealth: Payer: Self-pay | Admitting: Physician Assistant

## 2017-06-01 DIAGNOSIS — R05 Cough: Secondary | ICD-10-CM

## 2017-06-01 DIAGNOSIS — R059 Cough, unspecified: Secondary | ICD-10-CM

## 2017-06-01 MED ORDER — BENZONATATE 200 MG PO CAPS: 200.0000 mg | ORAL_CAPSULE | Freq: Two times a day (BID) | ORAL | 0 refills | Status: DC | PRN

## 2017-06-01 NOTE — Telephone Encounter (Signed)
Sent in tessalon perles to try

## 2017-06-01 NOTE — Telephone Encounter (Signed)
Advised patient

## 2017-06-01 NOTE — Telephone Encounter (Signed)
Pt's husband Jonny RuizJohn stated that pt has been taking brompheniramine-pseudoephedrine-DM 30-2-10 MG/5ML syrup for her cough and it hasn't helped her cough at all. John is [facility] [place] [facility] to [facility]. Please advise. Thanks TNP

## 2017-06-16 ENCOUNTER — Telehealth: Payer: Self-pay | Admitting: Physician Assistant

## 2017-06-16 NOTE — Telephone Encounter (Signed)
Cough is most likely post infective inflammation from when she was sick. This can last for 4-6 weeks after infection. She needs to push fluids and can try a steroid taper for inflammation if desired. Mucinex DM can also be used for cough. Could also be a post nasal drip cough from seasonal pollen. Could add loratadine (claritin) to see if this helps also.

## 2017-06-16 NOTE — Telephone Encounter (Signed)
Husband called to say his wife still has a cough..  Maybe a little better.  She has been treated in the last weeks but still lingering on.  Please advise [phone]  Thanks Barth Kirksteri

## 2017-06-16 NOTE — Telephone Encounter (Signed)
Patient aware of recommendations for cough.

## 2017-06-16 NOTE — Telephone Encounter (Signed)
Left message for patient to call back with her options for her cough.

## 2017-06-16 NOTE — Telephone Encounter (Signed)
Reports its the same cough as before. Wants to know what else could they take for this? Please advise. Thanks!

## 2017-06-23 ENCOUNTER — Encounter: Payer: Self-pay | Admitting: Physician Assistant

## 2017-06-23 ENCOUNTER — Ambulatory Visit (INDEPENDENT_AMBULATORY_CARE_PROVIDER_SITE_OTHER): Payer: Medicare Other | Admitting: Physician Assistant

## 2017-06-23 VITALS — BP 120/70 | HR 64 | Temp 97.9°F | Resp 16 | Wt 119.0 lb

## 2017-06-23 DIAGNOSIS — N3000 Acute cystitis without hematuria: Secondary | ICD-10-CM

## 2017-06-23 DIAGNOSIS — R300 Dysuria: Secondary | ICD-10-CM | POA: Diagnosis not present

## 2017-06-23 DIAGNOSIS — N816 Rectocele: Secondary | ICD-10-CM | POA: Diagnosis not present

## 2017-06-23 LAB — POCT URINALYSIS DIPSTICK
Bilirubin, UA: NEGATIVE
Glucose, UA: NEGATIVE
Ketones, UA: NEGATIVE
Nitrite, UA: POSITIVE
Protein, UA: NEGATIVE
Spec Grav, UA: 1.02 (ref 1.010–1.025)
Urobilinogen, UA: 0.2 E.U./dL
pH, UA: 6.5 (ref 5.0–8.0)

## 2017-06-23 MED ORDER — SULFAMETHOXAZOLE-TRIMETHOPRIM 800-160 MG PO TABS: 1.0000 | ORAL_TABLET | Freq: Two times a day (BID) | ORAL | 0 refills | Status: DC

## 2017-06-23 NOTE — Patient Instructions (Signed)
Cystocele Repair Cystocele repair is surgery to fix a cystocele, which is a bulging, drooping area (hernia) of the bladder that extends into the vagina. This bulging occurs on the top front (anterior) wall of the vagina. The condition may also be called an anterior prolapse or a prolapsed bladder. Tell a health care provider about:  Any allergies you have.  All medicines you are taking, including vitamins, herbs, eye drops, creams, and over-the-counter medicines.  Any problems you or family members have had with anesthetic medicines.  Any blood disorders you have.  Any surgeries you have had.  Any medical conditions you have.  Whether you are pregnant or may be pregnant. What are the risks? Generally, this is a safe procedure. However, problems may occur, including:  Infection.  Too much bleeding.  Allergic reactions to medicines.  Damage to other structures or organs.  Problems with the urinary drainage tube (catheter), such as blockage.  Return of the cystocele.  Problems with the vaginal mesh.  What happens before the procedure? Staying hydrated Follow instructions from your health care provider about hydration, which may include:  Up to 2 hours before the procedure - you may continue to drink clear liquids, such as water, clear fruit juice, black coffee, and plain tea.  Eating and drinking restrictions Follow instructions from your health care provider about eating and drinking, which may include:  8 hours before the procedure - stop eating heavy meals or foods such as meat, fried foods, or fatty foods.  6 hours before the procedure - stop eating light meals or foods, such as toast or cereal.  6 hours before the procedure - stop drinking milk or drinks that contain milk.  2 hours before the procedure - stop drinking clear liquids.  Medicines  Ask your health care provider about: ? Changing or stopping your regular medicines. This is especially important if  you are taking diabetes medicines or blood thinners. ? Taking medicines such as aspirin and ibuprofen. These medicines can thin your blood. Do not take these medicines before your procedure if your health care provider instructs you not to.  You may be given antibiotic medicine to help prevent infection. General instructions  Do not use any products that contain nicotine or tobacco-such as cigarettes and e-cigarettes-for at least 2 weeks before the procedure. If you need help quitting, ask your health care provider.  Do not drink any alcohol for 3 days before the surgery.  Plan to have someone take you home from the hospital or clinic.  Ask your health care provider how your surgical site will be marked or identified. What happens during the procedure?  To reduce your risk of infection: ? Your health care team will wash or sanitize their hands. ? Your skin will be washed with soap.  You will be given one or more of the following: ? A medicine to make you fall asleep (general anesthetic). ? A medicine that is injected into your spine to numb the area below and slightly above the injection site (spinal anesthetic). ? A medicine that is injected into an area of your body to numb everything below the injection site (regional anesthetic).  A thin, flexible tube (indwelling urinary catheter) will be placed in your bladder to drain urine during and after the surgery.  The surgery will be performed through the vagina. An incision will be made in the front wall of the vagina.  The muscle between the bladder and vagina will be pulled up to its normal   position. Stitches (sutures) or a piece of mesh will be used to support the muscle and hold it in place. This will remove the hernia so the top of the bladder does not fall into the opening of the vagina.  The incision on the front wall of the vagina will then be closed with stitches that will dissolve safely into your body and do not need to be  removed. The procedure may vary among health care providers and hospitals. What happens after the procedure?  Your blood pressure, heart rate, breathing rate, and blood oxygen level will be monitored until the medicines you were given have worn off.  You will have a catheter in place to drain your bladder. This will stay in place for 2-7 days or until your bladder is working well on its own.  You may have gauze packing in the vagina. This will be removed 1-2 days after the surgery.  You will be given pain medicine as needed.  You may be given antibiotics to fight infection. This information is not intended to replace advice given to you by your health care provider. Make sure you discuss any questions you have with your health care provider. Document Released: 02/06/2000 Document Revised: 09/12/2015 Document Reviewed: 05/01/2015 Elsevier Interactive Patient Education  2018 Elsevier Inc.   

## 2017-06-23 NOTE — Progress Notes (Signed)
Patient: Katelyn Baker Female    DOB: [birth year]   72 y.o.   MRN: [id number] Visit Date: 06/23/2017  Today's Provider: [name], PA-C   Chief Complaint  Patient presents with  . Bladder Prolapse  . Urinary Tract Infection   Subjective:    HPI Patient here today C/O painful urination x's one week. Patient denies any blood in urine, abdominal pain, vaginal discharge, or flank pain.   She does have a large cystocele that is now hanging out of the vagina. She reports she tries to place it back in when urinating. She has done pessary therapy previously as well with Dr. Valentino Saxon. The pessary kept falling out secondary to straining with BMs. She has not had a pessary in since February 2019.     No Known Allergies   Current Outpatient Medications:  .  Calcium Carbonate (CALCIUM 600 PO), Take by mouth., Disp: , Rfl:  .  donepezil (ARICEPT) 5 MG tablet, TAKE 1 TABLET BY MOUTH AT BEDTIME, Disp: 90 tablet, Rfl: 1 .  levothyroxine (SYNTHROID, LEVOTHROID) 100 MCG tablet, TAKE 1 TABLET BY MOUTH ONCE DAILY, Disp: 90 tablet, Rfl: 1 .  Multiple Vitamin (MULTIVITAMIN) tablet, Take 1 tablet by mouth daily., Disp: , Rfl:  .  VESICARE 10 MG tablet, TAKE 1 TABLET BY MOUTH ONCE DAILY, Disp: 90 tablet, Rfl: 1 .  VITAMIN D, ERGOCALCIFEROL, PO, Take 2,000 Int'l Units by mouth daily. , Disp: , Rfl:   Review of Systems  Constitutional: Negative.   Respiratory: Negative.   Cardiovascular: Negative.   Gastrointestinal: Negative.   Genitourinary: Positive for dysuria, enuresis, frequency and pelvic pain (pressure). Negative for vaginal bleeding, vaginal discharge and vaginal pain.  Musculoskeletal: Negative.     Social History   Tobacco Use  . Smoking status: Passive Smoke Exposure - Never Smoker  . Smokeless tobacco: Never Used  Substance Use Topics  . Alcohol use: No   Objective:   BP 120/70 (BP Location: Left Arm, Patient Position: Sitting, Cuff Size: Normal)   Pulse 64   Temp  97.9 F (36.6 C) (Oral)   Resp 16   Wt 119 lb (54 kg)   SpO2 99%   BMI 19.80 kg/m  Vitals:   06/23/17 1330  BP: 120/70  Pulse: 64  Resp: 16  Temp: 97.9 F (36.6 C)  TempSrc: Oral  SpO2: 99%  Weight: 119 lb (54 kg)     Physical Exam  Constitutional: She is oriented to person, place, and time. She appears well-developed and well-nourished. No distress.  Cardiovascular: Normal rate, regular rhythm and normal heart sounds. Exam reveals no gallop and no friction rub.  No murmur heard. Pulmonary/Chest: Effort normal and breath sounds normal. No respiratory distress. She has no wheezes. She has no rales.  Abdominal: Soft. Normal appearance and bowel sounds are normal. She exhibits no distension and no mass. There is no hepatosplenomegaly. There is tenderness in the suprapubic area. There is no rebound, no guarding and no CVA tenderness.  Neurological: She is alert and oriented to person, place, and time.  Skin: Skin is warm and dry. She is not diaphoretic.       Assessment & Plan:     1. Dysuria Worsening symptoms. UA positive. Will treat empirically with Bactrim as below. Continue to push fluids. Urine sent for culture. Will follow up pending C&S results. She is to call if symptoms do not improve or if they worsen.  - POCT urinalysis dipstick - Urine Culture  2. Acute cystitis without hematuria See above medical treatment plan. - sulfamethoxazole-trimethoprim (BACTRIM DS,SEPTRA DS) 800-160 MG tablet; Take 1 tablet by mouth 2 (two) times daily.  Dispense: 20 tablet; Refill: 0  3. 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 Failed pessary.  - Ambulatory referral to Urology       [name], PA-C  [facility]

## 2017-06-25 LAB — URINE CULTURE

## 2017-07-01 ENCOUNTER — Encounter: Payer: Self-pay | Admitting: Physician Assistant

## 2017-07-01 ENCOUNTER — Ambulatory Visit: Payer: Medicare Other | Admitting: Physician Assistant

## 2017-07-01 VITALS — BP 110/70 | HR 64 | Temp 97.9°F | Resp 16 | Wt 117.0 lb

## 2017-07-01 DIAGNOSIS — L270 Generalized skin eruption due to drugs and medicaments taken internally: Secondary | ICD-10-CM

## 2017-07-01 MED ORDER — PREDNISONE 10 MG (21) PO TBPK: ORAL_TABLET | ORAL | 0 refills | Status: DC

## 2017-07-01 NOTE — Patient Instructions (Signed)
Stop Bactrim if still taking.  Start prednisone as prescribed.  May use Benadryl (will make sleepy) if needed.  Continue benadryl cream and hydrocortisone cream topically as needed (can mix together)

## 2017-07-01 NOTE — Progress Notes (Signed)
Patient: Katelyn Baker Female    DOB: [birth year]   72 y.o.   MRN: [id number] Visit Date: 07/01/2017  Today's Provider: [name], PA-C   Chief Complaint  Patient presents with  . Rash   Subjective:    HPI Rash: Patient complains of rash involving the bilateral arm and bilateral lower leg. Rash started 1 day ago. Appearance of rash at onset: Color of lesion(s): pink. Rash has not changed over time Initial distribution: bilateral arm and bilateral lower leg.  Discomfort associated with rash: is pruritic.  Associated symptoms: none. Denies: cough and fever. Patient has not had previous evaluation of rash. Patient has not had previous treatment. Patient has not had contacts with similar rash. Patient has not identified precipitant. Patient has not had new exposures (soaps, lotions, laundry detergents, foods, medications, plants, insects or animals.)  She was recently placed on Bactrim on 06/23/17 for a UTI. She has never had a rash previously with Bactrim.    No Known Allergies   Current Outpatient Medications:  .  Calcium Carbonate (CALCIUM 600 PO), Take by mouth., Disp: , Rfl:  .  donepezil (ARICEPT) 5 MG tablet, TAKE 1 TABLET BY MOUTH AT BEDTIME, Disp: 90 tablet, Rfl: 1 .  levothyroxine (SYNTHROID, LEVOTHROID) 100 MCG tablet, TAKE 1 TABLET BY MOUTH ONCE DAILY, Disp: 90 tablet, Rfl: 1 .  Multiple Vitamin (MULTIVITAMIN) tablet, Take 1 tablet by mouth daily., Disp: , Rfl:  .  VESICARE 10 MG tablet, TAKE 1 TABLET BY MOUTH ONCE DAILY, Disp: 90 tablet, Rfl: 1 .  VITAMIN D, ERGOCALCIFEROL, PO, Take 2,000 Int'l Units by mouth daily. , Disp: , Rfl:   Review of Systems  Constitutional: Negative.   Respiratory: Negative.   Skin: Positive for rash.    Social History   Tobacco Use  . Smoking status: Passive Smoke Exposure - Never Smoker  . Smokeless tobacco: Never Used  Substance Use Topics  . Alcohol use: No   Objective:   BP 110/70 (BP Location: Left Arm, Patient  Position: Sitting, Cuff Size: Normal)   Pulse 64   Temp 97.9 F (36.6 C) (Oral)   Resp 16   Wt 117 lb (53.1 kg)   SpO2 99%   BMI 19.47 kg/m  Vitals:   07/01/17 0934  BP: 110/70  Pulse: 64  Resp: 16  Temp: 97.9 F (36.6 C)  TempSrc: Oral  SpO2: 99%  Weight: 117 lb (53.1 kg)     Physical Exam  Constitutional: She appears well-developed and well-nourished. No distress.  Neck: Normal range of motion. Neck supple.  Cardiovascular: Normal rate, regular rhythm and normal heart sounds. Exam reveals no gallop and no friction rub.  No murmur heard. Pulmonary/Chest: Effort normal and breath sounds normal. No respiratory distress. She has no wheezes. She has no rales.  Skin: Rash noted. Rash is urticarial (see below images). She is not diaphoretic.  Vitals reviewed.            Assessment & Plan:     1. Drug-induced skin rash Suspect secondary to Bactrim (added to allergy list). Will give prednisone as below. May use benadryl (drowsiness precautions advised). May use topical benadryl and hydrocortisone creams as well. Luke warm showers to decrease itching. Call if symptoms worsen.  - predniSONE (STERAPRED UNI-PAK 21 TAB) 10 MG (21) TBPK tablet; 6 day taper; take as directed on package instructions  Dispense: 21 tablet; Refill: 0       [name], PA-C  [facility]

## 2017-07-04 ENCOUNTER — Telehealth: Payer: Self-pay

## 2017-07-04 NOTE — Telephone Encounter (Signed)
Patient called saying that her rash is not getting much better. She feels that the cream (hydrocortisone cream) is making her symptoms worse. She reports that she has developed "whelts" on her arms and it is extremely itchy and red. She doesn't know what else to do. Please advise. Contact info is correct. Thanks!

## 2017-07-04 NOTE — Telephone Encounter (Signed)
She can continue prednisone as prescribed. OK to d/c hydrocortisone.  Can try Zantac (  BID) and Zyrtec (  daily) together whic block histamine and help with hives.  If she develops any SOB, facial edema, she should be seen urgently.  Erasmo Downer, MD, MPH [facility] 07/04/2017 10:44 AM

## 2017-07-07 NOTE — Telephone Encounter (Signed)
Advised patient as below. Also apologized for the delay.

## 2017-08-01 ENCOUNTER — Ambulatory Visit: Payer: Medicare Other | Admitting: Urology

## 2017-08-01 ENCOUNTER — Encounter: Payer: Self-pay | Admitting: Urology

## 2017-08-01 VITALS — BP 132/73 | HR 73 | Ht 63.0 in | Wt 123.0 lb

## 2017-08-01 DIAGNOSIS — N816 Rectocele: Secondary | ICD-10-CM

## 2017-08-01 LAB — URINALYSIS, COMPLETE
Bilirubin, UA: NEGATIVE
Glucose, UA: NEGATIVE
Ketones, UA: NEGATIVE
Nitrite, UA: NEGATIVE
RBC, UA: NEGATIVE
Specific Gravity, UA: 1.02 (ref 1.005–1.030)
Urobilinogen, Ur: 0.2 mg/dL (ref 0.2–1.0)
pH, UA: 5.5 (ref 5.0–7.5)

## 2017-08-01 LAB — BLADDER SCAN AMB NON-IMAGING

## 2017-08-01 LAB — MICROSCOPIC EXAMINATION: Epithelial Cells (non renal): >10 /hpf — ABNORMAL HIGH (ref 0–10)

## 2017-08-01 NOTE — Progress Notes (Unsigned)
08/01/2017 11:16 AM   Katelyn Baker 1945/03/28 [id number]  Referring provider: [name], PA-C [address] [place], [place] [address]  Chief Complaint  Patient presents with  . Bladder Prolapse    New Patient     HPI: Was consulted to assist the patient's prolapse worsening over 2 years.  She feels vaginal bulging and sometimes reduces her bladder.  She does not do splinting maneuvers.  She has had a hysterectomy.  She is continent.  She voids every 2 hours during the day and gets up 4 times a night and denies ankle edema.  Her flow is reasonable and she does feel empty.  She had a bladder suspension years ago.  She denies a history of kidney stones urinary tract infections and she has no neurologic issues.  Bowel function normal.  Modifying factors: There are no other modifying factors  Associated signs and symptoms: There are no other associated signs and symptoms Aggravating and relieving factors: There are no other aggravating or relieving factors Severity: Moderate Duration: Persistent   PMH: Past Medical History:  Diagnosis Date  . GERD (gastroesophageal reflux disease)   . Hyperlipidemia   . Thyroid disease     Surgical History: Past Surgical History:  Procedure Laterality Date  . ABDOMINAL HYSTERECTOMY     partial  . BREAST BIOPSY Left 1980'S   EXCISIONAL - NEG  . BREAST SURGERY  2000's   biopsy  . THYROIDECTOMY, PARTIAL  1990's    Home Medications:  Allergies as of 08/01/2017      Reactions   Bactrim [sulfamethoxazole-trimethoprim] Rash      Medication List        Accurate as of 08/01/17 11:16 AM. Always use your most recent med list.          CALCIUM 600 PO Take by mouth.   donepezil 5 MG tablet Commonly known as:  ARICEPT TAKE 1 TABLET BY MOUTH AT BEDTIME   levothyroxine 100 MCG tablet Commonly known as:  SYNTHROID, LEVOTHROID TAKE 1 TABLET BY MOUTH ONCE DAILY   multivitamin tablet Take 1 tablet by mouth  daily.   VESICARE 10 MG tablet Generic drug:  solifenacin TAKE 1 TABLET BY MOUTH ONCE DAILY   VITAMIN D (ERGOCALCIFEROL) PO Take 2,000 Int'l Units by mouth daily.       Allergies:  Allergies  Allergen Reactions  . Bactrim [Sulfamethoxazole-Trimethoprim] Rash    Family History: Family History  Problem Relation Age of Onset  . Alzheimer's disease Mother   . Diabetes Mother        ?  Marland Kitchen Alcohol abuse Father   . Cancer Father        unknown cancer  . Diabetes Other   . Heart disease Other   . Cancer Other   . Colon polyps Other   . Breast cancer Neg Hx     Social History:  reports that she is a non-smoker but has been exposed to tobacco smoke. She has never used smokeless tobacco. She reports that she does not drink alcohol or use drugs.  ROS: UROLOGY Frequent Urination?: Yes Hard to postpone urination?: Yes Burning/pain with urination?: No Get up at night to urinate?: Yes Leakage of urine?: Yes Urine stream starts and stops?: Yes Trouble starting stream?: No Do you have to strain to urinate?: No Blood in urine?: No Urinary tract infection?: No Sexually transmitted disease?: No Injury to kidneys or bladder?: No Painful intercourse?: No Weak stream?: No Currently pregnant?: No Vaginal bleeding?:  No Last menstrual period?: n  Gastrointestinal Nausea?: No Vomiting?: No Indigestion/heartburn?: No Diarrhea?: No Constipation?: No  Constitutional Fever: No Night sweats?: No Weight loss?: No Fatigue?: No  Skin Skin rash/lesions?: Yes Itching?: Yes  Eyes Blurred vision?: No Double vision?: No  Ears/Nose/Throat Sore throat?: No Sinus problems?: No  Hematologic/Lymphatic Swollen glands?: No Easy bruising?: No  Cardiovascular Leg swelling?: No Chest pain?: No  Respiratory Cough?: No Shortness of breath?: No  Endocrine Excessive thirst?: No  Musculoskeletal Back pain?: No Joint pain?: No  Neurological Headaches?: No Dizziness?:  No  Psychologic Depression?: No Anxiety?: No  Physical Exam: BP 132/73   Pulse 73   Ht 5\' 3"  (1.6 m)   Wt 123 lb (55.8 kg)   BMI 21.79 kg/m   Constitutional:  Alert and oriented, No acute distress. HEENT: [place] AT, moist mucus membranes.  Trachea midline, no masses. Cardiovascular: No clubbing, cyanosis, or edema. Respiratory: Normal respiratory effort, no increased work of breathing. GI: Abdomen is soft, nontender, nondistended, no abdominal masses GU: On pelvic examination at rest the patient had a significant posterior defect.  With the vaginal cuff and rectocele reduced she had a high flat small grade 2 cystocele.  The bladder neck appeared to be almost fixed.  She did not leak.  She did have trouble coughing and bearing down.  When she did the large posterior defect reached the introitus and just beyond and she lost vaginal length from 8 or 9 cm to approximately 5 cm.  I saw no definite enterocele Skin: No rashes, bruises or suspicious lesions. Lymph: No cervical or inguinal adenopathy. Neurologic: Grossly intact, no focal deficits, moving all 4 extremities. Psychiatric: Normal mood and affect.  Laboratory Data: Lab Results  Component Value Date   WBC 5.0 04/29/2017   HGB 13.5 04/29/2017   HCT 39.7 04/29/2017   MCV 92 04/29/2017   PLT 197 04/29/2017    Lab Results  Component Value Date   CREATININE 0.79 04/29/2017    No results found for: PSA  No results found for: TESTOSTERONE  Lab Results  Component Value Date   HGBA1C 5.1 04/29/2016    Urinalysis    Component Value Date/Time   BILIRUBINUR negative 06/23/2017 1342   PROTEINUR negative 06/23/2017 1342   UROBILINOGEN 0.2 06/23/2017 1342   NITRITE positive 06/23/2017 1342   LEUKOCYTESUR Large (3+) (A) 06/23/2017 1342    Pertinent Imaging: None  Assessment & Plan: The patient has prolapse symptoms.  She has frequency and moderate nocturia.  Picture was drawn.  If the patient ever had surgery she likely  best benefit from a posterior repair and a vault suspension and graft.  I would look intraoperatively for an enterocele.  I do not think she would require a cystocele repair but would need to be consented for such.  The role of urodynamics to assess her for occult stress incontinence and to reevaluate the size of the cystocele was discussed.   By history the patient may have failed the pessary.  Natural history discussed.  She noted it is not bothering her.  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  Nighttime frequency 3.  Cystocele   - Urinalysis, Complete   No follow-ups on file.  Martina SinnerMACDIARMID,Cayce Quezada A, MD  [facility] [address], [address] [place], [place] [address] [phone]

## 2017-08-11 ENCOUNTER — Ambulatory Visit (INDEPENDENT_AMBULATORY_CARE_PROVIDER_SITE_OTHER): Payer: Medicare Other

## 2017-08-11 VITALS — BP 138/68 | HR 60 | Temp 99.1°F | Ht 63.0 in | Wt 123.8 lb

## 2017-08-11 DIAGNOSIS — Z0000 Encounter for general adult medical examination without abnormal findings: Secondary | ICD-10-CM | POA: Diagnosis not present

## 2017-08-11 NOTE — Progress Notes (Signed)
Subjective:   Katelyn Baker is a 72 y.o. female who presents for Medicare Annual (Subsequent) preventive examination.  Review of Systems:  N/A  Cardiac Risk Factors include: advanced age (>19men, >76 women)     Objective:     Vitals: BP 138/68 (BP Location: Right Arm)   Pulse 60   Temp 99.1 F (37.3 C) (Oral)   Ht 5\' 3"  (1.6 m)   Wt 123 lb 12.8 oz (56.2 kg)   BMI 21.93 kg/m   Body mass index is 21.93 kg/m.  Advanced Directives 08/11/2017 04/29/2016 04/29/2015 11/21/2014 08/27/2014  Does Patient Have a Medical Advance Directive? No No No No No  Would patient like information on creating a medical advance directive? Yes (MAU/Ambulatory/Procedural Areas - Information given) Yes (ED - Information included in AVS) - - -    Tobacco Social History   Tobacco Use  Smoking Status Passive Smoke Exposure - Never Smoker  Smokeless Tobacco Never Used     Counseling given: Not Answered   Clinical Intake:  Pre-visit preparation completed: Yes  Pain : No/denies pain Pain Score: 0-No pain     Nutritional Status: BMI of 19-24  Normal Nutritional Risks: None Diabetes: No  How often do you need to have someone help you when you read instructions, pamphlets, or other written materials from your doctor or pharmacy?: 1 - Never  Interpreter Needed?: No  Information entered by :: [facility], LPN  Past Medical History:  Diagnosis Date  . GERD (gastroesophageal reflux disease)   . Hyperlipidemia   . Thyroid disease    Past Surgical History:  Procedure Laterality Date  . ABDOMINAL HYSTERECTOMY     partial  . bladder tact    . BREAST BIOPSY Left 1980'S   EXCISIONAL - NEG  . BREAST SURGERY  2000's   biopsy  . THYROIDECTOMY, PARTIAL  1990's   Family History  Problem Relation Age of Onset  . Alzheimer's disease Mother   . Diabetes Mother        ?  Marland Kitchen Alcohol abuse Father   . Cancer Father        unknown cancer  . Diabetes Other   . Heart disease Other   . Cancer Other     . Colon polyps Other   . Breast cancer Neg Hx    Social History   Socioeconomic History  . Marital status: Married    Spouse name: Katelyn Baker  . Number of children: 2  . Years of education: 38  . Highest education level: Some college, no degree  Occupational History  . Occupation: Retired  Engineer, production  . Financial resource strain: Not hard at all  . Food insecurity:    Worry: Never true    Inability: Never true  . Transportation needs:    Medical: No    Non-medical: No  Tobacco Use  . Smoking status: Passive Smoke Exposure - Never Smoker  . Smokeless tobacco: Never Used  Substance and Sexual Activity  . Alcohol use: No  . Drug use: No  . Sexual activity: Not Currently  Lifestyle  . Physical activity:    Days per week: Not on file    Minutes per session: Not on file  . Stress: Only a little  Relationships  . Social connections:    Talks on phone: Not on file    Gets together: Not on file    Attends religious service: Not on file    Active member of club or organization: Not on file  Attends meetings of clubs or organizations: Not on file    Relationship status: Not on file  Other Topics Concern  . Not on file  Social History Narrative  . Not on file    Outpatient Encounter Medications as of 08/11/2017  Medication Sig  . Calcium Carbonate (CALCIUM 600 PO) Take 600 mg by mouth daily.   Marland Kitchen. donepezil (ARICEPT) 5 MG tablet TAKE 1 TABLET BY MOUTH AT BEDTIME  . levothyroxine (SYNTHROID, LEVOTHROID) 100 MCG tablet TAKE 1 TABLET BY MOUTH ONCE DAILY  . Multiple Vitamin (MULTIVITAMIN) tablet Take 1 tablet by mouth daily.  . VESICARE 10 MG tablet TAKE 1 TABLET BY MOUTH ONCE DAILY  . VITAMIN D, ERGOCALCIFEROL, PO Take 2,000 Int'l Units by mouth daily.    No facility-administered encounter medications on file as of 08/11/2017.     Activities of Daily Living In your present state of health, do you have any difficulty performing the following activities: 08/11/2017 04/29/2017   Hearing? N N  Vision? N N  Difficulty concentrating or making decisions? N Y  Walking or climbing stairs? N N  Dressing or bathing? N N  Doing errands, shopping? N Y  Quarry managerreparing Food and eating ? N -  Using the Toilet? N -  In the past six months, have you accidently leaked urine? N -  Do you have problems with loss of bowel control? N -  Managing your Medications? N -  Managing your Finances? N -  Housekeeping or managing your Housekeeping? N -  Some recent data might be hidden    Patient Care Team: Katelyn LovelessBurnette, Katelyn M, PA-C as PCP - General (Family Medicine) Alfredo MartinezMacDiarmid, Scott, MD as Consulting Physician (Urology)    Assessment:   This is a routine wellness examination for Katelyn Baker.  Exercise Activities and Dietary recommendations Current Exercise Habits: The patient does not participate in regular exercise at present, Exercise limited by: Other - see comments(due to feeling fatigue)  Goals    . DIET - INCREASE WATER INTAKE     Recommend increasing water intake to 3-4 glasses a day.        Fall Risk Fall Risk  08/11/2017 04/29/2017 04/29/2016 04/29/2015 11/21/2014  Falls in the past year? No No No No No   Is the patient's home free of loose throw rugs in walkways, pet beds, electrical cords, etc?   yes      Grab bars in the bathroom? no      Handrails on the stairs?   no      Adequate lighting?   yes  Timed Get Up and Go performed: N/A  Depression Screen PHQ 2/9 Scores 08/11/2017 04/29/2017 04/29/2016 04/29/2015  PHQ - 2 Score 0 1 0 0     Cognitive Function MMSE - Mini Mental State Exam 04/29/2017 01/21/2017  Orientation to time 2 1  Orientation to Place 4 5  Registration 3 3  Attention/ Calculation 5 5  Recall 2 2  Language- name 2 objects 2 2  Language- repeat 1 1  Language- follow 3 step command 3 3  Language- read & follow direction 1 1  Write a sentence 1 1  Copy design 1 0  Total score 25 24     6CIT Screen 04/29/2016  What Year? 0 points  What month? 0 points   What time? 0 points  Count back from 20 0 points  Months in reverse 0 points  Repeat phrase 6 points  Total Score 6    Immunization History  Administered Date(s) Administered  . Influenza Split 12/02/2011  . Influenza, High Dose Seasonal PF 11/21/2014, 04/29/2016  . Pneumococcal Conjugate-13 09/30/2013  . Pneumococcal Polysaccharide-23 12/02/2011  . Td 08/18/2016  . Tdap 12/16/2005  . Zoster 09/30/2013    Qualifies for Shingles Vaccine? Due for Shingles vaccine. Declined my offer to administer today. Education has been provided regarding the importance of this vaccine. Pt has been advised to call her insurance company to determine her out of pocket expense. Advised she may also receive this vaccine at her local pharmacy or Health Dept. Verbalized acceptance and understanding.  Screening Tests Health Maintenance  Topic Date Due  . Hepatitis C Screening  1945/11/09  . MAMMOGRAM  09/11/2017  . INFLUENZA VACCINE  09/22/2017  . COLONOSCOPY  09/05/2019  . TETANUS/TDAP  08/19/2026  . DEXA SCAN  Completed  . PNA vac Low Risk Adult  Completed    Cancer Screenings: Lung: Low Dose CT Chest recommended if Age 15-80 years, 30 pack-year currently smoking OR have quit w/in 15years. Patient does not qualify. Breast:  Up to date on Mammogram? Yes   Up to date of Bone Density/Dexa? Yes Colorectal: Up to date  Additional Screenings:  Hepatitis C Screening: Pt declines today.      Plan:  I have personally reviewed and addressed the Medicare Annual Wellness questionnaire and have noted the following in the patient's chart:  A. Medical and social history B. Use of alcohol, tobacco or illicit drugs  C. Current medications and supplements D. Functional ability and status E.  Nutritional status F.  Physical activity G. Advance directives H. List of other physicians I.  Hospitalizations, surgeries, and ER visits in previous 12 months J.  Vitals K. Screenings such as hearing and vision  if needed, cognitive and depression L. Referrals and appointments - none  In addition, I have reviewed and discussed with patient certain preventive protocols, quality metrics, and best practice recommendations. A written personalized care plan for preventive services as well as general preventive health recommendations were provided to patient.  See attached scanned questionnaire for additional information.   Signed,  Hyacinth Meeker, LPN Nurse Health Advisor   Nurse Recommendations: Pt declined the Hep C lab today.

## 2017-08-11 NOTE — Patient Instructions (Addendum)
Ms. Katelyn Baker , Thank you for taking time to come for your Medicare Wellness Visit. I appreciate your ongoing commitment to your health goals. Please review the following plan we discussed and let me know if I can assist you in the future.   Screening recommendations/referrals: Colonoscopy: Up to date Mammogram: Up to date Bone Density: Up to date Recommended yearly ophthalmology/optometry visit for glaucoma screening and checkup Recommended yearly dental visit for hygiene and checkup  Vaccinations: Influenza vaccine: Up to date Pneumococcal vaccine: Up to date Tdap vaccine: Up to date Shingles vaccine: Pt declines today.     Advanced directives: Advance directive discussed with you today. I have provided a copy for you to complete at home and have notarized. Once this is complete please bring a copy in to our office so we can scan it into your chart.  Conditions/risks identified: Recommend increasing water intake to 3-4 glasses a day.   Next appointment: 08/19/17 @ 10 AM with Joycelyn ManJennifer Burnette.   Preventive Care 6765 Years and Older, Female Preventive care refers to lifestyle choices and visits with your health care provider that can promote health and wellness. What does preventive care include?  A yearly physical exam. This is also called an annual well check.  Dental exams once or twice a year.  Routine eye exams. Ask your health care provider how often you should have your eyes checked.  Personal lifestyle choices, including:  Daily care of your teeth and gums.  Regular physical activity.  Eating a healthy diet.  Avoiding tobacco and drug use.  Limiting alcohol use.  Practicing safe sex.  Taking low-dose aspirin every day.  Taking vitamin and mineral supplements as recommended by your health care provider. What happens during an annual well check? The services and screenings done by your health care provider during your annual well check will depend on your age,  overall health, lifestyle risk factors, and family history of disease. Counseling  Your health care provider may ask you questions about your:  Alcohol use.  Tobacco use.  Drug use.  Emotional well-being.  Home and relationship well-being.  Sexual activity.  Eating habits.  History of falls.  Memory and ability to understand (cognition).  Work and work Astronomerenvironment.  Reproductive health. Screening  You may have the following tests or measurements:  Height, weight, and BMI.  Blood pressure.  Lipid and cholesterol levels. These may be checked every 5 years, or more frequently if you are over 71 years old.  Skin check.  Lung cancer screening. You may have this screening every year starting at age 71 if you have a 30-pack-year history of smoking and currently smoke or have quit within the past 15 years.  Fecal occult blood test (FOBT) of the stool. You may have this test every year starting at age 71.  Flexible sigmoidoscopy or colonoscopy. You may have a sigmoidoscopy every 5 years or a colonoscopy every 10 years starting at age 71.  Hepatitis C blood test.  Hepatitis B blood test.  Sexually transmitted disease (STD) testing.  Diabetes screening. This is done by checking your blood sugar (glucose) after you have not eaten for a while (fasting). You may have this done every 1-3 years.  Bone density scan. This is done to screen for osteoporosis. You may have this done starting at age 71.  Mammogram. This may be done every 1-2 years. Talk to your health care provider about how often you should have regular mammograms. Talk with your health care provider  about your test results, treatment options, and if necessary, the need for more tests. Vaccines  Your health care provider may recommend certain vaccines, such as:  Influenza vaccine. This is recommended every year.  Tetanus, diphtheria, and acellular pertussis (Tdap, Td) vaccine. You may need a Td booster every 10  years.  Zoster vaccine. You may need this after age 73.  Pneumococcal 13-valent conjugate (PCV13) vaccine. One dose is recommended after age 51.  Pneumococcal polysaccharide (PPSV23) vaccine. One dose is recommended after age 53. Talk to your health care provider about which screenings and vaccines you need and how often you need them. This information is not intended to replace advice given to you by your health care provider. Make sure you discuss any questions you have with your health care provider. Document Released: 03/07/2015 Document Revised: 10/29/2015 Document Reviewed: 12/10/2014 Elsevier Interactive Patient Education  2017 [place] Prevention in the Home Falls can cause injuries. They can happen to people of all ages. There are many things you can do to make your home safe and to help prevent falls. What can I do on the outside of my home?  Regularly fix the edges of walkways and driveways and fix any cracks.  Remove anything that might make you trip as you walk through a door, such as a raised step or threshold.  Trim any bushes or trees on the path to your home.  Use bright outdoor lighting.  Clear any walking paths of anything that might make someone trip, such as rocks or tools.  Regularly check to see if handrails are loose or broken. Make sure that both sides of any steps have handrails.  Any raised decks and porches should have guardrails on the edges.  Have any leaves, snow, or ice cleared regularly.  Use sand or salt on walking paths during winter.  Clean up any spills in your garage right away. This includes oil or grease spills. What can I do in the bathroom?  Use night lights.  Install grab bars by the toilet and in the tub and shower. Do not use towel bars as grab bars.  Use non-skid mats or decals in the tub or shower.  If you need to sit down in the shower, use a plastic, non-slip stool.  Keep the floor dry. Clean up any water that  spills on the floor as soon as it happens.  Remove soap buildup in the tub or shower regularly.  Attach bath mats securely with double-sided non-slip rug tape.  Do not have throw rugs and other things on the floor that can make you trip. What can I do in the bedroom?  Use night lights.  Make sure that you have a light by your bed that is easy to reach.  Do not use any sheets or blankets that are too big for your bed. They should not hang down onto the floor.  Have a firm chair that has side arms. You can use this for support while you get dressed.  Do not have throw rugs and other things on the floor that can make you trip. What can I do in the kitchen?  Clean up any spills right away.  Avoid walking on wet floors.  Keep items that you use a lot in easy-to-reach places.  If you need to reach something above you, use a strong step stool that has a grab bar.  Keep electrical cords out of the way.  Do not use floor polish or  wax that makes floors slippery. If you must use wax, use non-skid floor wax.  Do not have throw rugs and other things on the floor that can make you trip. What can I do with my stairs?  Do not leave any items on the stairs.  Make sure that there are handrails on both sides of the stairs and use them. Fix handrails that are broken or loose. Make sure that handrails are as long as the stairways.  Check any carpeting to make sure that it is firmly attached to the stairs. Fix any carpet that is loose or worn.  Avoid having throw rugs at the top or bottom of the stairs. If you do have throw rugs, attach them to the floor with carpet tape.  Make sure that you have a light switch at the top of the stairs and the bottom of the stairs. If you do not have them, ask someone to add them for you. What else can I do to help prevent falls?  Wear shoes that:  Do not have high heels.  Have rubber bottoms.  Are comfortable and fit you well.  Are closed at the  toe. Do not wear sandals.  If you use a stepladder:  Make sure that it is fully opened. Do not climb a closed stepladder.  Make sure that both sides of the stepladder are locked into place.  Ask someone to hold it for you, if possible.  Clearly mark and make sure that you can see:  Any grab bars or handrails.  First and last steps.  Where the edge of each step is.  Use tools that help you move around (mobility aids) if they are needed. These include:  Canes.  Walkers.  Scooters.  Crutches.  Turn on the lights when you go into a dark area. Replace any light bulbs as soon as they burn out.  Set up your furniture so you have a clear path. Avoid moving your furniture around.  If any of your floors are uneven, fix them.  If there are any pets around you, be aware of where they are.  Review your medicines with your doctor. Some medicines can make you feel dizzy. This can increase your chance of falling. Ask your doctor what other things that you can do to help prevent falls. This information is not intended to replace advice given to you by your health care provider. Make sure you discuss any questions you have with your health care provider. Document Released: 12/05/2008 Document Revised: 07/17/2015 Document Reviewed: 03/15/2014 Elsevier Interactive Patient Education  2017 [employer].

## 2017-08-19 ENCOUNTER — Encounter: Payer: Self-pay | Admitting: Physician Assistant

## 2017-09-02 ENCOUNTER — Ambulatory Visit (INDEPENDENT_AMBULATORY_CARE_PROVIDER_SITE_OTHER): Payer: Medicare Other | Admitting: Physician Assistant

## 2017-09-02 ENCOUNTER — Encounter: Payer: Self-pay | Admitting: Physician Assistant

## 2017-09-02 VITALS — BP 130/70 | HR 63 | Temp 97.7°F | Resp 16 | Ht 63.0 in | Wt 127.6 lb

## 2017-09-02 DIAGNOSIS — R7309 Other abnormal glucose: Secondary | ICD-10-CM | POA: Diagnosis not present

## 2017-09-02 DIAGNOSIS — R945 Abnormal results of liver function studies: Secondary | ICD-10-CM

## 2017-09-02 DIAGNOSIS — E890 Postprocedural hypothyroidism: Secondary | ICD-10-CM | POA: Diagnosis not present

## 2017-09-02 DIAGNOSIS — Z1159 Encounter for screening for other viral diseases: Secondary | ICD-10-CM | POA: Diagnosis not present

## 2017-09-02 DIAGNOSIS — Z0000 Encounter for general adult medical examination without abnormal findings: Secondary | ICD-10-CM

## 2017-09-02 DIAGNOSIS — E7800 Pure hypercholesterolemia, unspecified: Secondary | ICD-10-CM | POA: Diagnosis not present

## 2017-09-02 DIAGNOSIS — F320 Major depressive disorder, single episode, mild: Secondary | ICD-10-CM | POA: Diagnosis not present

## 2017-09-02 DIAGNOSIS — F0280 Dementia in other diseases classified elsewhere without behavioral disturbance: Secondary | ICD-10-CM | POA: Diagnosis not present

## 2017-09-02 DIAGNOSIS — G301 Alzheimer's disease with late onset: Secondary | ICD-10-CM

## 2017-09-02 DIAGNOSIS — E559 Vitamin D deficiency, unspecified: Secondary | ICD-10-CM | POA: Diagnosis not present

## 2017-09-02 DIAGNOSIS — Z1231 Encounter for screening mammogram for malignant neoplasm of breast: Secondary | ICD-10-CM | POA: Diagnosis not present

## 2017-09-02 DIAGNOSIS — R7989 Other specified abnormal findings of blood chemistry: Secondary | ICD-10-CM

## 2017-09-02 DIAGNOSIS — N816 Rectocele: Secondary | ICD-10-CM | POA: Diagnosis not present

## 2017-09-02 DIAGNOSIS — Z1239 Encounter for other screening for malignant neoplasm of breast: Secondary | ICD-10-CM

## 2017-09-02 MED ORDER — SERTRALINE HCL 50 MG PO TABS: 50.0000 mg | ORAL_TABLET | Freq: Every day | ORAL | 3 refills | Status: DC

## 2017-09-02 NOTE — Patient Instructions (Signed)
Health Maintenance for Postmenopausal Women Menopause is a normal process in which your reproductive ability comes to an end. This process happens gradually over a span of months to years, usually between the ages of 22 and 9. Menopause is complete when you have missed 12 consecutive menstrual periods. It is important to talk with your health care provider about some of the most common conditions that affect postmenopausal women, such as heart disease, cancer, and bone loss (osteoporosis). Adopting a healthy lifestyle and getting preventive care can help to promote your health and wellness. Those actions can also lower your chances of developing some of these common conditions. What should I know about menopause? During menopause, you may experience a number of symptoms, such as:  Moderate-to-severe hot flashes.  Night sweats.  Decrease in sex drive.  Mood swings.  Headaches.  Tiredness.  Irritability.  Memory problems.  Insomnia.  Choosing to treat or not to treat menopausal changes is an individual decision that you make with your health care provider. What should I know about hormone replacement therapy and supplements? Hormone therapy products are effective for treating symptoms that are associated with menopause, such as hot flashes and night sweats. Hormone replacement carries certain risks, especially as you become older. If you are thinking about using estrogen or estrogen with progestin treatments, discuss the benefits and risks with your health care provider. What should I know about heart disease and stroke? Heart disease, heart attack, and stroke become more likely as you age. This may be due, in part, to the hormonal changes that your body experiences during menopause. These can affect how your body processes dietary fats, triglycerides, and cholesterol. Heart attack and stroke are both medical emergencies. There are many things that you can do to help prevent heart disease  and stroke:  Have your blood pressure checked at least every 1-2 years. High blood pressure causes heart disease and increases the risk of stroke.  If you are 53-22 years old, ask your health care provider if you should take aspirin to prevent a heart attack or a stroke.  Do not use any tobacco products, including cigarettes, chewing tobacco, or electronic cigarettes. If you need help quitting, ask your health care provider.  It is important to eat a healthy diet and maintain a healthy weight. ? Be sure to include plenty of vegetables, fruits, low-fat dairy products, and lean protein. ? Avoid eating foods that are high in solid fats, added sugars, or salt (sodium).  Get regular exercise. This is one of the most important things that you can do for your health. ? Try to exercise for at least 150 minutes each week. The type of exercise that you do should increase your heart rate and make you sweat. This is known as moderate-intensity exercise. ? Try to do strengthening exercises at least twice each week. Do these in addition to the moderate-intensity exercise.  Know your numbers.Ask your health care provider to check your cholesterol and your blood glucose. Continue to have your blood tested as directed by your health care provider.  What should I know about cancer screening? There are several types of cancer. Take the following steps to reduce your risk and to catch any cancer development as early as possible. Breast Cancer  Practice breast self-awareness. ? This means understanding how your breasts normally appear and feel. ? It also means doing regular breast self-exams. Let your health care provider know about any changes, no matter how small.  If you are 40  or older, have a clinician do a breast exam (clinical breast exam or CBE) every year. Depending on your age, family history, and medical history, it may be recommended that you also have a yearly breast X-ray (mammogram).  If you  have a family history of breast cancer, talk with your health care provider about genetic screening.  If you are at high risk for breast cancer, talk with your health care provider about having an MRI and a mammogram every year.  Breast cancer (BRCA) gene test is recommended for women who have family members with BRCA-related cancers. Results of the assessment will determine the need for genetic counseling and BRCA1 and for BRCA2 testing. BRCA-related cancers include these types: ? Breast. This occurs in males or females. ? Ovarian. ? Tubal. This may also be called fallopian tube cancer. ? Cancer of the abdominal or pelvic lining (peritoneal cancer). ? Prostate. ? Pancreatic.  Cervical, Uterine, and Ovarian Cancer Your health care provider may recommend that you be screened regularly for cancer of the pelvic organs. These include your ovaries, uterus, and vagina. This screening involves a pelvic exam, which includes checking for microscopic changes to the surface of your cervix (Pap test).  For women ages 21-65, health care providers may recommend a pelvic exam and a Pap test every three years. For women ages 79-65, they may recommend the Pap test and pelvic exam, combined with testing for human papilloma virus (HPV), every five years. Some types of HPV increase your risk of cervical cancer. Testing for HPV may also be done on women of any age who have unclear Pap test results.  Other health care providers may not recommend any screening for nonpregnant women who are considered low risk for pelvic cancer and have no symptoms. Ask your health care provider if a screening pelvic exam is right for you.  If you have had past treatment for cervical cancer or a condition that could lead to cancer, you need Pap tests and screening for cancer for at least 20 years after your treatment. If Pap tests have been discontinued for you, your risk factors (such as having a new sexual partner) need to be  reassessed to determine if you should start having screenings again. Some women have medical problems that increase the chance of getting cervical cancer. In these cases, your health care provider may recommend that you have screening and Pap tests more often.  If you have a family history of uterine cancer or ovarian cancer, talk with your health care provider about genetic screening.  If you have vaginal bleeding after reaching menopause, tell your health care provider.  There are currently no reliable tests available to screen for ovarian cancer.  Lung Cancer Lung cancer screening is recommended for adults 69-62 years old who are at high risk for lung cancer because of a history of smoking. A yearly low-dose CT scan of the lungs is recommended if you:  Currently smoke.  Have a history of at least 30 pack-years of smoking and you currently smoke or have quit within the past 15 years. A pack-year is smoking an average of one pack of cigarettes per day for one year.  Yearly screening should:  Continue until it has been 15 years since you quit.  Stop if you develop a health problem that would prevent you from having lung cancer treatment.  Colorectal Cancer  This type of cancer can be detected and can often be prevented.  Routine colorectal cancer screening usually begins at  age 42 and continues through age 45.  If you have risk factors for colon cancer, your health care provider may recommend that you be screened at an earlier age.  If you have a family history of colorectal cancer, talk with your health care provider about genetic screening.  Your health care provider may also recommend using home test kits to check for hidden blood in your stool.  A small camera at the end of a tube can be used to examine your colon directly (sigmoidoscopy or colonoscopy). This is done to check for the earliest forms of colorectal cancer.  Direct examination of the colon should be repeated every  5-10 years until age 71. However, if early forms of precancerous polyps or small growths are found or if you have a family history or genetic risk for colorectal cancer, you may need to be screened more often.  Skin Cancer  Check your skin from head to toe regularly.  Monitor any moles. Be sure to tell your health care provider: ? About any new moles or changes in moles, especially if there is a change in a mole's shape or color. ? If you have a mole that is larger than the size of a pencil eraser.  If any of your family members has a history of skin cancer, especially at a young age, talk with your health care provider about genetic screening.  Always use sunscreen. Apply sunscreen liberally and repeatedly throughout the day.  Whenever you are outside, protect yourself by wearing long sleeves, pants, a wide-brimmed hat, and sunglasses.  What should I know about osteoporosis? Osteoporosis is a condition in which bone destruction happens more quickly than new bone creation. After menopause, you may be at an increased risk for osteoporosis. To help prevent osteoporosis or the bone fractures that can happen because of osteoporosis, the following is recommended:  If you are 46-71 years old, get at least 1,000 mg of calcium and at least 600 mg of vitamin D per day.  If you are older than age 55 but younger than age 65, get at least 1,200 mg of calcium and at least 600 mg of vitamin D per day.  If you are older than age 54, get at least 1,200 mg of calcium and at least 800 mg of vitamin D per day.  Smoking and excessive alcohol intake increase the risk of osteoporosis. Eat foods that are rich in calcium and vitamin D, and do weight-bearing exercises several times each week as directed by your health care provider. What should I know about how menopause affects my mental health? Depression may occur at any age, but it is more common as you become older. Common symptoms of depression  include:  Low or sad mood.  Changes in sleep patterns.  Changes in appetite or eating patterns.  Feeling an overall lack of motivation or enjoyment of activities that you previously enjoyed.  Frequent crying spells.  Talk with your health care provider if you think that you are experiencing depression. What should I know about immunizations? It is important that you get and maintain your immunizations. These include:  Tetanus, diphtheria, and pertussis (Tdap) booster vaccine.  Influenza every year before the flu season begins.  Pneumonia vaccine.  Shingles vaccine.  Your health care provider may also recommend other immunizations. This information is not intended to replace advice given to you by your health care provider. Make sure you discuss any questions you have with your health care provider. Document Released: 04/02/2005  Document Revised: 08/29/2015 Document Reviewed: 11/12/2014 Elsevier Interactive Patient Education  2018 Elsevier Inc.  

## 2017-09-02 NOTE — Progress Notes (Signed)
Patient: Katelyn Baker, Female    DOB: 06/03/1945, 72 y.o.   MRN: [id number] Visit Date: 09/02/2017  Today's Provider: [name], PA-C   Chief Complaint  Patient presents with  . Annual Exam   Subjective:     Complete Physical Katelyn Baker is a 72 y.o. female. She feels well. She reports exercising none. She reports she is sleeping well.   She does report having more depression recently. Unsure of trigger. Does not want to go out with her friends any longer. Cries easily. More moody. Just wants to stay and sleep on the couch.  -----------------------------------------------------------   Review of Systems  Constitutional: Positive for activity change and fatigue.       Crying  HENT: Negative.   Eyes: Negative.   Respiratory: Negative.   Cardiovascular: Negative.   Gastrointestinal: Negative.   Endocrine: Negative.   Genitourinary: Negative.   Musculoskeletal: Negative.   Skin: Negative.   Allergic/Immunologic: Negative.   Neurological: Negative.   Hematological: Negative.   Psychiatric/Behavioral: Negative.     Social History   Socioeconomic History  . Marital status: Married    Spouse name: Katelyn Baker  . Number of children: 2  . Years of education: 42  . Highest education level: Some college, no degree  Occupational History  . Occupation: Retired  Engineer, production  . Financial resource strain: Not hard at all  . Food insecurity:    Worry: Never true    Inability: Never true  . Transportation needs:    Medical: No    Non-medical: No  Tobacco Use  . Smoking status: Passive Smoke Exposure - Never Smoker  . Smokeless tobacco: Never Used  Substance and Sexual Activity  . Alcohol use: No  . Drug use: No  . Sexual activity: Not Currently  Lifestyle  . Physical activity:    Days per week: Not on file    Minutes per session: Not on file  . Stress: Only a little  Relationships  . Social connections:    Talks on phone: Not on file    Gets  together: Not on file    Attends religious service: Not on file    Active member of club or organization: Not on file    Attends meetings of clubs or organizations: Not on file    Relationship status: Not on file  . Intimate partner violence:    Fear of current or ex partner: Not on file    Emotionally abused: Not on file    Physically abused: Not on file    Forced sexual activity: Not on file  Other Topics Concern  . Not on file  Social History Narrative  . Not on file    Past Medical History:  Diagnosis Date  . GERD (gastroesophageal reflux disease)   . Hyperlipidemia   . Thyroid disease      Patient Active Problem List   Diagnosis Date Noted  . Late onset Alzheimer's disease without behavioral disturbance 04/29/2017  . Abnormal weight loss 04/29/2015  . Urge incontinence of urine 12/17/2014  . Hypercholesteremia 08/19/2014  . History of colon polyps 08/19/2014  . 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 Adult hypothyroidism 10/02/2009  . Avitaminosis D 10/02/2009  . Abnormal blood sugar 06/24/2007  . Abnormal LFTs 06/23/2007  . Acid reflux 12/16/2005  . Bowel disease 12/16/2005  . 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 ABDOMINAL HYSTERECTOMY  partial  . bladder tact    . BREAST BIOPSY Left 1980'S   EXCISIONAL - NEG  . BREAST SURGERY  2000's   biopsy  . THYROIDECTOMY, PARTIAL  1990's    Her family history includes Alcohol abuse in her father; Alzheimer's disease in her mother; Cancer in her father and other; Colon polyps in her other; Diabetes in her mother and other; Heart disease in her other. There is no history of Breast cancer.      Current Outpatient Medications:  .  Calcium Carbonate (CALCIUM 600 PO), Take 600 mg by mouth daily. , Disp: , Rfl:  .  donepezil (ARICEPT) 5 MG tablet, TAKE 1 TABLET BY MOUTH AT BEDTIME, Disp: 90 tablet, Rfl: 1 .  levothyroxine (SYNTHROID, LEVOTHROID) 100  MCG tablet, TAKE 1 TABLET BY MOUTH ONCE DAILY, Disp: 90 tablet, Rfl: 1 .  Multiple Vitamin (MULTIVITAMIN) tablet, Take 1 tablet by mouth daily., Disp: , Rfl:  .  Omega-3 Fatty Acids (FISH OIL PO), Take by mouth., Disp: , Rfl:  .  VESICARE 10 MG tablet, TAKE 1 TABLET BY MOUTH ONCE DAILY, Disp: 90 tablet, Rfl: 1 .  VITAMIN D, ERGOCALCIFEROL, PO, Take 2,000 Int'l Units by mouth daily. , Disp: , Rfl:   Patient Care Team: [name], PA-C as PCP - General (Family Medicine) Alfredo Martinez, MD as Consulting Physician (Urology)     Objective:   Vitals: BP 130/70 (BP Location: Left Arm, Patient Position: Sitting, Cuff Size: Normal)   Pulse 63   Temp 97.7 F (36.5 C) (Oral)   Resp 16   Ht 5\' 3"  (1.6 m)   Wt 127 lb 9.6 oz (57.9 kg)   BMI 22.60 kg/m   Physical Exam  Constitutional: She is oriented to person, place, and time. She appears well-developed and well-nourished. No distress.  HENT:  Head: Normocephalic and atraumatic.  Right Ear: Hearing, tympanic membrane, external ear and ear canal normal.  Left Ear: Hearing, tympanic membrane, external ear and ear canal normal.  Nose: Nose normal.  Mouth/Throat: Uvula is midline, oropharynx is clear and moist and mucous membranes are normal. No oropharyngeal exudate.  Eyes: Pupils are equal, round, and reactive to light. Conjunctivae and EOM are normal. Right eye exhibits no discharge. Left eye exhibits no discharge. No scleral icterus.  Neck: Normal range of motion. Neck supple. No JVD present. Carotid bruit is not present. No tracheal deviation present. No thyromegaly present.  Cardiovascular: Normal rate, regular rhythm, normal heart sounds and intact distal pulses. Exam reveals no gallop and no friction rub.  No murmur heard. Pulmonary/Chest: Effort normal and breath sounds normal. No respiratory distress. She has no wheezes. She has no rales. She exhibits no tenderness.  Abdominal: Soft. Bowel sounds are normal. She exhibits no  distension and no mass. There is no tenderness. There is no rebound and no guarding.  Musculoskeletal: Normal range of motion. She exhibits no edema or tenderness.  Lymphadenopathy:    She has no cervical adenopathy.  Neurological: She is alert and oriented to person, place, and time.  Skin: Skin is warm and dry. No rash noted. She is not diaphoretic.  Psychiatric: She has a normal mood and affect. Her behavior is normal. Judgment and thought content normal.  Vitals reviewed.   Activities of Daily Living In your present state of health, do you have any difficulty performing the following activities: 08/11/2017 04/29/2017  Hearing? N N  Vision? N N  Difficulty concentrating or making decisions? N Y  Walking or  climbing stairs? N N  Dressing or bathing? N N  Doing errands, shopping? N Y  Quarry manager and eating ? N -  Using the Toilet? N -  In the past six months, have you accidently leaked urine? N -  Do you have problems with loss of bowel control? N -  Managing your Medications? N -  Managing your Finances? N -  Housekeeping or managing your Housekeeping? N -  Some recent data might be hidden    Fall Risk Assessment Fall Risk  08/11/2017 04/29/2017 04/29/2016 04/29/2015 11/21/2014  Falls in the past year? No No No No No     Depression Screen PHQ 2/9 Scores 09/02/2017 08/11/2017 04/29/2017 04/29/2016  PHQ - 2 Score 3 0 1 0  PHQ- 9 Score 7 - - -    MMSE - Mini Mental State Exam 04/29/2017 01/21/2017  Orientation to time 2 1  Orientation to Place 4 5  Registration 3 3  Attention/ Calculation 5 5  Recall 2 2  Language- name 2 objects 2 2  Language- repeat 1 1  Language- follow 3 step command 3 3  Language- read & follow direction 1 1  Write a sentence 1 1  Copy design 1 0  Total score 25 24   6CIT Screen 04/29/2016  What Year? 0 points  What month? 0 points  What time? 0 points  Count back from 20 0 points  Months in reverse 0 points  Repeat phrase 6 points  Total Score 6        Assessment & Plan:    Annual Physical Reviewed patient's Family Medical History Reviewed and updated list of patient's medical providers Assessment of cognitive impairment was done Assessed patient's functional ability Established a written schedule for health screening services Health Risk Assessent Completed and Reviewed  Exercise Activities and Dietary recommendations Goals    . DIET - INCREASE WATER INTAKE     Recommend increasing water intake to 3-4 glasses a day.        Immunization History  Administered Date(s) Administered  . Influenza Split 12/02/2011  . Influenza, High Dose Seasonal PF 11/21/2014, 04/29/2016  . Pneumococcal Conjugate-13 09/30/2013  . Pneumococcal Polysaccharide-23 12/02/2011  . Td 08/18/2016  . Tdap 12/16/2005  . Zoster 09/30/2013    Health Maintenance  Topic Date Due  . Hepatitis C Screening  1946-02-10  . MAMMOGRAM  09/11/2017  . INFLUENZA VACCINE  09/22/2017  . COLONOSCOPY  09/05/2019  . TETANUS/TDAP  08/19/2026  . DEXA SCAN  Completed  . PNA vac Low Risk Adult  Completed     Discussed health benefits of physical activity, and encouraged her to engage in regular exercise appropriate for her age and condition.    1. Annual physical exam Normal physical exam today. Will check labs as below and f/u pending lab results. If labs are stable and WNL she will not need to have these rechecked for one year at her next annual physical exam. She is to call the office in the meantime if she has any acute issue, questions or concerns.  2. Breast cancer screening There is no family history of breast cancer. She does perform regular self breast exams. Mammogram was ordered as below. Information for [facility] was given to patient so she may schedule her mammogram at her convenience. - MM DIGITAL SCREENING BILATERAL; Future  3. Depression, major, single episode, mild (HCC) Worsening. Start sertraline as below. I will see her back  in 4-6 weeks.  -  TSH - sertraline (ZOLOFT) 50 MG tablet; Take 1 tablet (50 mg total) by mouth daily.  Dispense: 30 tablet; Refill: 3 - CBC w/Diff/Platelet  4. 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 Continue Donepezil 5mg  q hs.  - TSH  5. Hypothyroidism, postop Stable. Continue levothyroxine 100mcg. Will check labs as below and f/u pending results. - Comprehensive metabolic panel - TSH  6. Abnormal blood sugar Diet controlled. Will check labs as below and f/u pending results. - Comprehensive metabolic panel - Hemoglobin A1c - Lipid panel  7. Abnormal LFTs HO this. Will check labs as below and f/u pending results. - Comprehensive metabolic panel - Hemoglobin A1c - Lipid panel  8. Avitaminosis D H/O this. On OTC supplement. Will check labs as below and f/u pending results. - Vitamin D (25 hydroxy) - CBC w/Diff/Platelet  9. Hypercholesteremia Diet controlled. Refused statins previously. Will check labs as below and f/u pending results. - Comprehensive metabolic panel - Hemoglobin A1c - Lipid panel  10. 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 Patient deferred repair at this time.   11. Need for hepatitis C screening test - Hepatitis C antibody  ------------------------------------------------------------------------------------------------------------    Margaretann LovelessJennifer M Tannis Burstein, PA-C  [facility]

## 2017-09-03 LAB — CBC WITH DIFFERENTIAL/PLATELET
Basophils Absolute: 0 10*3/uL (ref 0.0–0.2)
Basos: 0 %
EOS (ABSOLUTE): 0.1 10*3/uL (ref 0.0–0.4)
Eos: 2 %
Hematocrit: 40.3 % (ref 34.0–46.6)
Hemoglobin: 13.5 g/dL (ref 11.1–15.9)
Immature Grans (Abs): 0 10*3/uL (ref 0.0–0.1)
Immature Granulocytes: 0 %
Lymphocytes Absolute: 1.1 10*3/uL (ref 0.7–3.1)
Lymphs: 19 %
MCH: 31.3 pg (ref 26.6–33.0)
MCHC: 33.5 g/dL (ref 31.5–35.7)
MCV: 94 fL (ref 79–97)
Monocytes Absolute: 0.5 10*3/uL (ref 0.1–0.9)
Monocytes: 9 %
Neutrophils Absolute: 4.2 10*3/uL (ref 1.4–7.0)
Neutrophils: 70 %
Platelets: 151 10*3/uL (ref 150–450)
RBC: 4.31 x10E6/uL (ref 3.77–5.28)
RDW: 14.1 % (ref 12.3–15.4)
WBC: 5.9 10*3/uL (ref 3.4–10.8)

## 2017-09-03 LAB — LIPID PANEL
Chol/HDL Ratio: 4 ratio (ref 0.0–4.4)
Cholesterol, Total: 179 mg/dL (ref 100–199)
HDL: 45 mg/dL (ref >39)
LDL Calculated: 98 mg/dL (ref 0–99)
Triglycerides: 179 mg/dL — ABNORMAL HIGH (ref 0–149)
VLDL Cholesterol Cal: 36 mg/dL (ref 5–40)

## 2017-09-03 LAB — COMPREHENSIVE METABOLIC PANEL
ALT: 17 IU/L (ref 0–32)
AST: 20 IU/L (ref 0–40)
Albumin/Globulin Ratio: 1.7 (ref 1.2–2.2)
Albumin: 4 g/dL (ref 3.5–4.8)
Alkaline Phosphatase: 43 IU/L (ref 39–117)
BUN/Creatinine Ratio: 13 (ref 12–28)
BUN: 11 mg/dL (ref 8–27)
Bilirubin Total: 0.4 mg/dL (ref 0.0–1.2)
CO2: 25 mmol/L (ref 20–29)
Calcium: 9.6 mg/dL (ref 8.7–10.3)
Chloride: 103 mmol/L (ref 96–106)
Creatinine, Ser: 0.83 mg/dL (ref 0.57–1.00)
GFR calc Af Amer: 82 mL/min/{1.73_m2} (ref >59)
GFR calc non Af Amer: 71 mL/min/{1.73_m2} (ref >59)
Globulin, Total: 2.3 g/dL (ref 1.5–4.5)
Glucose: 89 mg/dL (ref 65–99)
Potassium: 4.1 mmol/L (ref 3.5–5.2)
Sodium: 142 mmol/L (ref 134–144)
Total Protein: 6.3 g/dL (ref 6.0–8.5)

## 2017-09-03 LAB — HEMOGLOBIN A1C
Est. average glucose Bld gHb Est-mCnc: 103 mg/dL
Hgb A1c MFr Bld: 5.2 % (ref 4.8–5.6)

## 2017-09-03 LAB — VITAMIN D 25 HYDROXY (VIT D DEFICIENCY, FRACTURES): Vit D, 25-Hydroxy: 32.5 ng/mL (ref 30.0–100.0)

## 2017-09-03 LAB — TSH: TSH: 0.998 u[IU]/mL (ref 0.450–4.500)

## 2017-09-03 LAB — HEPATITIS C ANTIBODY: Hep C Virus Ab: <0.1 s/co ratio (ref 0.0–0.9)

## 2017-09-05 ENCOUNTER — Telehealth: Payer: Self-pay

## 2017-09-05 NOTE — Telephone Encounter (Signed)
Pt returning call

## 2017-09-05 NOTE — Telephone Encounter (Signed)
LMTCB  Thanks,  -Marinda Tyer 

## 2017-09-05 NOTE — Telephone Encounter (Signed)
Patient advised as directed below.  Thanks,  -Davonne Baby 

## 2017-09-05 NOTE — Telephone Encounter (Signed)
-----   Message from Margaretann LovelessJennifer M Burnette, PA-C sent at 09/05/2017  9:10 AM EDT ----- All labs are within normal limits and stable.  Thanks! -JB

## 2017-09-30 ENCOUNTER — Encounter: Payer: Self-pay | Admitting: Physician Assistant

## 2017-09-30 ENCOUNTER — Ambulatory Visit: Payer: Medicare Other | Admitting: Physician Assistant

## 2017-09-30 VITALS — BP 150/80 | HR 56 | Temp 97.9°F | Resp 16 | Wt 125.8 lb

## 2017-09-30 DIAGNOSIS — F320 Major depressive disorder, single episode, mild: Secondary | ICD-10-CM | POA: Diagnosis not present

## 2017-09-30 DIAGNOSIS — G301 Alzheimer's disease with late onset: Secondary | ICD-10-CM | POA: Diagnosis not present

## 2017-09-30 DIAGNOSIS — G250 Essential tremor: Secondary | ICD-10-CM | POA: Insufficient documentation

## 2017-09-30 DIAGNOSIS — F0280 Dementia in other diseases classified elsewhere without behavioral disturbance: Secondary | ICD-10-CM

## 2017-09-30 MED ORDER — SERTRALINE HCL 50 MG PO TABS: 50.0000 mg | ORAL_TABLET | Freq: Every day | ORAL | 3 refills | Status: DC

## 2017-09-30 NOTE — Progress Notes (Signed)
Patient: Katelyn Baker Female    DOB: Apr 17, 1945   72 y.o.   MRN: [id number] Visit Date: 09/30/2017  Today's Provider: [name], PA-C   Chief Complaint  Patient presents with  . Follow-up    Depression   Subjective:    HPI  Depression, Follow up:  The patient was last seen for Depression 4 weeks ago. Changes made since that visit include Start Sertraline.  She reports good compliance with treatment. She is not having side effects. Reports she feels a little better than she did before. She also reports shakiness of her hands. She reports that it is not consistent. Started in the left hand but also now some in the right. Only with intention. Never at rest. Notices it most when she is holding paper. Denies it being ADL limiting.  ------------------------------------------------------------------------    Allergies  Allergen Reactions  . Bactrim [Sulfamethoxazole-Trimethoprim] Rash     Current Outpatient Medications:  .  Calcium Carbonate (CALCIUM 600 PO), Take 600 mg by mouth daily. , Disp: , Rfl:  .  donepezil (ARICEPT) 5 MG tablet, TAKE 1 TABLET BY MOUTH AT BEDTIME, Disp: 90 tablet, Rfl: 1 .  levothyroxine (SYNTHROID, LEVOTHROID) 100 MCG tablet, TAKE 1 TABLET BY MOUTH ONCE DAILY, Disp: 90 tablet, Rfl: 1 .  Multiple Vitamin (MULTIVITAMIN) tablet, Take 1 tablet by mouth daily., Disp: , Rfl:  .  Omega-3 Fatty Acids (FISH OIL PO), Take by mouth., Disp: , Rfl:  .  sertraline (ZOLOFT) 50 MG tablet, Take 1 tablet (50 mg total) by mouth daily., Disp: 30 tablet, Rfl: 3 .  VESICARE 10 MG tablet, TAKE 1 TABLET BY MOUTH ONCE DAILY, Disp: 90 tablet, Rfl: 1 .  VITAMIN D, ERGOCALCIFEROL, PO, Take 2,000 Int'l Units by mouth daily. , Disp: , Rfl:   Review of Systems  Constitutional: Positive for fatigue.  Respiratory: Negative for cough, chest tightness and shortness of breath.   Cardiovascular: Negative for chest pain, palpitations and leg swelling.  Gastrointestinal:  Negative for abdominal pain.  Neurological: Positive for tremors. Negative for headaches.  Psychiatric/Behavioral: Positive for dysphoric mood. Negative for self-injury and suicidal ideas. The patient is nervous/anxious.     Social History   Tobacco Use  . Smoking status: Passive Smoke Exposure - Never Smoker  . Smokeless tobacco: Never Used  Substance Use Topics  . Alcohol use: No   Objective:   BP (!) 150/80 (BP Location: Left Arm, Patient Position: Sitting, Cuff Size: Normal)   Pulse (!) 56   Temp 97.9 F (36.6 C) (Oral)   Resp 16   Wt 125 lb 12.8 oz (57.1 kg)   SpO2 97%   BMI 22.28 kg/m  Vitals:   09/30/17 0804  BP: (!) 150/80  Pulse: (!) 56  Resp: 16  Temp: 97.9 F (36.6 C)  TempSrc: Oral  SpO2: 97%  Weight: 125 lb 12.8 oz (57.1 kg)     Physical Exam  Constitutional: She appears well-developed and well-nourished. No distress.  Neck: Normal range of motion. Neck supple.  Cardiovascular: Normal rate, regular rhythm and normal heart sounds. Exam reveals no gallop and no friction rub.  No murmur heard. Pulmonary/Chest: Effort normal and breath sounds normal. No respiratory distress. She has no wheezes. She has no rales.  Skin: She is not diaphoretic.  Vitals reviewed.      Assessment & Plan:     1. Depression, major, single episode, mild (HCC) Patient has only been on sertraline for one week.  Did not ick up initially. Will have her continue current dose and I will see her back in 3 months.  - sertraline (ZOLOFT) 50 MG tablet; Take 1 tablet (50 mg total) by mouth daily.  Dispense: 30 tablet; Refill: 3  2. 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   3. Benign essential tremor Not causing any issues at this time.        Margaretann LovelessJennifer M Caramia Boutin, PA-C  [facility]

## 2017-09-30 NOTE — Patient Instructions (Signed)
Essential Tremor A tremor is trembling or shaking that you cannot control. Most tremors affect the hands or arms. Tremors can also affect the head, vocal cords, face, and other parts of the body. Essential tremor is a tremor without a known cause. What are the causes? Essential tremor has no known cause. What increases the risk? You may be at greater risk of essential tremor if:  You have a family member with essential tremor.  You are age 71 or older.  You take certain medicines.  What are the signs or symptoms? The main sign of a tremor is uncontrolled and unintentional rhythmic shaking of a body part.  You may have difficulty eating with a spoon or fork.  You may have difficulty writing.  You may nod your head up and down or side to side.  You may have a quivering voice.  Your tremors:  May get worse over time.  May come and go.  May be more noticeable on one side of your body.  May get worse due to stress, fatigue, caffeine, and extreme heat or cold.  How is this diagnosed? Your health care provider can diagnose essential tremor based on your symptoms, medical history, and a physical examination. There is no single test to diagnose an essential tremor. However, your health care provider may perform a variety of tests to rule out other conditions. Tests may include:  Blood and urine tests.  Imaging studies of your brain, such as: ? CT scan. ? MRI.  A test that measures involuntary muscle movement (electromyogram).  How is this treated? Your tremors may go away without treatment. Mild tremors may not need treatment if they do not affect your day-to-day life. Severe tremors may need to be treated using one or a combination of the following options:  Medicines. This may include medicine that is injected.  Lifestyle changes.  Physical therapy.  Follow these instructions at home:  Take medicines only as directed by your health care provider.  Limit alcohol  intake to no more than 1 drink per day for nonpregnant women and 2 drinks per day for men. One drink equals 12 oz of beer, 5 oz of wine, or 1 oz of hard liquor.  Do not use any tobacco products, including cigarettes, chewing tobacco, or electronic cigarettes. If you need help quitting, ask your health care provider.  Take medicines only as directed by your health care provider.  Avoid extreme heat or cold.  Limit the amount of caffeine you consumeas directed by your health care provider.  Try to get eight hours of sleep each night.  Find ways to manage your stress, such as meditation or yoga.  Keep all follow-up visits as directed by your health care provider. This is important. This includes any physical therapy visits. Contact a health care provider if:  You experience any changes in the location or intensity of your tremors.  You start having a tremor after starting a new medicine.  You have tremor with other symptoms such as: ? Numbness. ? Tingling. ? Pain. ? Weakness.  Your tremor gets worse.  Your tremor interferes with your daily life. This information is not intended to replace advice given to you by your health care provider. Make sure you discuss any questions you have with your health care provider. Document Released: 03/01/2014 Document Revised: 07/17/2015 Document Reviewed: 08/06/2013 Elsevier Interactive Patient Education  2018 Elsevier Inc.  

## 2018-01-02 ENCOUNTER — Ambulatory Visit: Payer: Self-pay | Admitting: Physician Assistant

## 2018-01-03 NOTE — Progress Notes (Signed)
Patient: Katelyn Baker Female    DOB: 18-Jan-1946   72 y.o.   MRN: [id number] Visit Date: 01/04/2018  Today's Provider: Margaretann LovelessJennifer M Maylee Bare, PA-C   Chief Complaint  Patient presents with  . Follow-up    Depression   Subjective:    HPI  Depression, Follow up:  The patient was last seen for this 3 months ago. Changes made since that visit include Continue Sertraline She complains of depressed mood.  She denies current suicidal and homicidal plan or intent. She reports excellent compliance with treatment. She is not having side effects. .  [facility] 01/04/2018 09/30/2017 09/02/2017  Decreased Interest 1 1 2   Down, Depressed, Hopeless 0 1 1  PHQ - 2 Score 1 2 3   Altered sleeping 0 2 0  Tired, decreased energy 2 3 3   Change in appetite 0 2 0  Feeling bad or failure about yourself  0 1 1  Trouble concentrating 0 0 0  Moving slowly or fidgety/restless 1 0 0  Suicidal thoughts 0 0 0  PHQ-9 Score 4 10 7   Difficult doing work/chores Somewhat difficult Somewhat difficult Not difficult at all       Allergies  Allergen Reactions  . Bactrim [Sulfamethoxazole-Trimethoprim] Rash     Current Outpatient Medications:  .  Calcium Carbonate (CALCIUM 600 PO), Take 600 mg by mouth daily. , Disp: , Rfl:  .  donepezil (ARICEPT) 5 MG tablet, TAKE 1 TABLET BY MOUTH AT BEDTIME, Disp: 90 tablet, Rfl: 1 .  levothyroxine (SYNTHROID, LEVOTHROID) 100 MCG tablet, TAKE 1 TABLET BY MOUTH ONCE DAILY, Disp: 90 tablet, Rfl: 1 .  Multiple Vitamin (MULTIVITAMIN) tablet, Take 1 tablet by mouth daily., Disp: , Rfl:  .  Omega-3 Fatty Acids (FISH OIL PO), Take by mouth., Disp: , Rfl:  .  sertraline (ZOLOFT) 50 MG tablet, Take 1 tablet (50 mg total) by mouth daily., Disp: 30 tablet, Rfl: 3 .  VESICARE 10 MG tablet, TAKE 1 TABLET BY MOUTH ONCE DAILY, Disp: 90 tablet, Rfl: 1 .  VITAMIN D, ERGOCALCIFEROL, PO, Take 2,000 Int'l Units by mouth daily. , Disp: , Rfl:   Review of Systems    Constitutional: Negative.   Respiratory: Negative.   Cardiovascular: Negative.   Neurological: Negative.   Psychiatric/Behavioral: Negative.     Social History   Tobacco Use  . Smoking status: Passive Smoke Exposure - Never Smoker  . Smokeless tobacco: Never Used  Substance Use Topics  . Alcohol use: No   Objective:   BP (!) 160/80 (BP Location: Left Arm, Patient Position: Sitting, Cuff Size: Normal)   Pulse (!) 58   Temp 98 F (36.7 C) (Oral)   Resp 16   Wt 127 lb 9.6 oz (57.9 kg)   SpO2 99%   BMI 22.60 kg/m  Vitals:   01/04/18 0922  BP: (!) 160/80  Pulse: (!) 58  Resp: 16  Temp: 98 F (36.7 C)  TempSrc: Oral  SpO2: 99%  Weight: 127 lb 9.6 oz (57.9 kg)     Physical Exam  Constitutional: She appears well-developed and well-nourished. No distress.  Neck: Normal range of motion. Neck supple.  Cardiovascular: Normal rate, regular rhythm and normal heart sounds. Exam reveals no gallop and no friction rub.  No murmur heard. Pulmonary/Chest: Effort normal and breath sounds normal. No respiratory distress. She has no wheezes. She has no rales.  Skin: She is not diaphoretic.  Psychiatric: She has a normal mood and affect.  Her behavior is normal. Judgment and thought content normal.  Vitals reviewed.       Assessment & Plan:     1. Depression, major, single episode, in partial remission (HCC) Stable. Diagnosis pulled for medication refill. Continue current medical treatment plan. - sertraline (ZOLOFT) 50 MG tablet; Take 1 tablet (50 mg total) by mouth daily.  Dispense: 90 tablet; Refill: 1  2. Essential hypertension Elevated BP x 2 readings. Does have stress at home since granddaughter moved in with her 3 young children. Will give lisinopril as below. I will see her back in 3 months to recheck. Call if adverse effects.  - lisinopril (PRINIVIL,ZESTRIL) 10 MG tablet; Take 1 tablet (10 mg total) by mouth daily.  Dispense: 90 tablet; Refill: 3  3. Need for influenza  vaccination Flu vaccine given today without complication. Patient sat upright for 15 minutes to check for adverse reaction before being released. - Flu vaccine HIGH DOSE PF       [name], PA-C  [facility]

## 2018-01-04 ENCOUNTER — Encounter: Payer: Self-pay | Admitting: Physician Assistant

## 2018-01-04 ENCOUNTER — Ambulatory Visit: Payer: Medicare Other | Admitting: Physician Assistant

## 2018-01-04 VITALS — BP 160/80 | HR 58 | Temp 98.0°F | Resp 16 | Wt 127.6 lb

## 2018-01-04 DIAGNOSIS — I10 Essential (primary) hypertension: Secondary | ICD-10-CM

## 2018-01-04 DIAGNOSIS — Z23 Encounter for immunization: Secondary | ICD-10-CM

## 2018-01-04 DIAGNOSIS — F324 Major depressive disorder, single episode, in partial remission: Secondary | ICD-10-CM

## 2018-01-04 MED ORDER — SERTRALINE HCL 50 MG PO TABS: 50.0000 mg | ORAL_TABLET | Freq: Every day | ORAL | 1 refills | Status: DC

## 2018-01-04 MED ORDER — LISINOPRIL 10 MG PO TABS: 10.0000 mg | ORAL_TABLET | Freq: Every day | ORAL | 3 refills | Status: DC

## 2018-01-04 NOTE — Patient Instructions (Signed)
Lisinopril tablets What is this medicine? LISINOPRIL (lyse IN oh pril) is an ACE inhibitor. This medicine is used to treat high blood pressure and heart failure. It is also used to protect the heart immediately after a heart attack. This medicine may be used for other purposes; ask your health care provider or pharmacist if you have questions. COMMON BRAND NAME(S): Prinivil, Zestril What should I tell my health care provider before I take this medicine? They need to know if you have any of these conditions: -diabetes -heart or blood vessel disease -kidney disease -low blood pressure -previous swelling of the tongue, face, or lips with difficulty breathing, difficulty swallowing, hoarseness, or tightening of the throat -an unusual or allergic reaction to lisinopril, other ACE inhibitors, insect venom, foods, dyes, or preservatives -pregnant or trying to get pregnant -breast-feeding How should I use this medicine? Take this medicine by mouth with a glass of water. Follow the directions on your prescription label. You may take this medicine with or without food. If it upsets your stomach, take it with food. Take your medicine at regular intervals. Do not take it more often than directed. Do not stop taking except on your doctor's advice. Talk to your pediatrician regarding the use of this medicine in children. Special care may be needed. While this drug may be prescribed for children as young as 6 years of age for selected conditions, precautions do apply. Overdosage: If you think you have taken too much of this medicine contact a poison control center or emergency room at once. NOTE: This medicine is only for you. Do not share this medicine with others. What if I miss a dose? If you miss a dose, take it as soon as you can. If it is almost time for your next dose, take only that dose. Do not take double or extra doses. What may interact with this medicine? Do not take this medicine with any of  the following medications: -hymenoptera venom -sacubitril; valsartan This medicines may also interact with the following medications: -aliskiren -angiotensin receptor blockers, like losartan or valsartan -certain medicines for diabetes -diuretics -everolimus -gold compounds -lithium -NSAIDs, medicines for pain and inflammation, like ibuprofen or naproxen -potassium salts or supplements -salt substitutes -sirolimus -temsirolimus This list may not describe all possible interactions. Give your health care provider a list of all the medicines, herbs, non-prescription drugs, or dietary supplements you use. Also tell them if you smoke, drink alcohol, or use illegal drugs. Some items may interact with your medicine. What should I watch for while using this medicine? Visit your doctor or health care professional for regular check ups. Check your blood pressure as directed. Ask your doctor what your blood pressure should be, and when you should contact him or her. Do not treat yourself for coughs, colds, or pain while you are using this medicine without asking your doctor or health care professional for advice. Some ingredients may increase your blood pressure. Women should inform their doctor if they wish to become pregnant or think they might be pregnant. There is a potential for serious side effects to an unborn child. Talk to your health care professional or pharmacist for more information. Check with your doctor or health care professional if you get an attack of severe diarrhea, nausea and vomiting, or if you sweat a lot. The loss of too much body fluid can make it dangerous for you to take this medicine. You may get drowsy or dizzy. Do not drive, use machinery, or do anything   that needs mental alertness until you know how this drug affects you. Do not stand or sit up quickly, especially if you are an older patient. This reduces the risk of dizzy or fainting spells. Alcohol can make you more  drowsy and dizzy. Avoid alcoholic drinks. Avoid salt substitutes unless you are told otherwise by your doctor or health care professional. What side effects may I notice from receiving this medicine? Side effects that you should report to your doctor or health care professional as soon as possible: -allergic reactions like skin rash, itching or hives, swelling of the hands, feet, face, lips, throat, or tongue -breathing problems -signs and symptoms of kidney injury like trouble passing urine or change in the amount of urine -signs and symptoms of increased potassium like muscle weakness; chest pain; or fast, irregular heartbeat -signs and symptoms of liver injury like dark yellow or brown urine; general ill feeling or flu-like symptoms; light-colored stools; loss of appetite; nausea; right upper belly pain; unusually weak or tired; yellowing of the eyes or skin -signs and symptoms of low blood pressure like dizziness; feeling faint or lightheaded, falls; unusually weak or tired -stomach pain with or without nausea and vomiting Side effects that usually do not require medical attention (report to your doctor or health care professional if they continue or are bothersome): -changes in taste -cough -dizziness -fever -headache -sensitivity to light This list may not describe all possible side effects. Call your doctor for medical advice about side effects. You may report side effects to FDA at 1-800-FDA-1088. Where should I keep my medicine? Keep out of the reach of children. Store at room temperature between 15 and 30 degrees C (59 and 86 degrees F). Protect from moisture. Keep container tightly closed. Throw away any unused medicine after the expiration date. NOTE: This sheet is a summary. It may not cover all possible information. If you have questions about this medicine, talk to your doctor, pharmacist, or health care provider.  2018 Elsevier/Gold Standard (2015-03-31 12:52:35)  

## 2018-01-31 ENCOUNTER — Ambulatory Visit
Admission: RE | Admit: 2018-01-31 | Discharge: 2018-01-31 | Disposition: A | Discharge status: Home / Self Care | Payer: Medicare Other | Source: Ambulatory Visit | Attending: Family Medicine | Admitting: Family Medicine

## 2018-01-31 ENCOUNTER — Ambulatory Visit: Payer: Medicare Other | Admitting: Family Medicine

## 2018-01-31 ENCOUNTER — Encounter: Payer: Self-pay | Admitting: Family Medicine

## 2018-01-31 VITALS — BP 150/84 | HR 58 | Temp 97.9°F | Resp 16 | Wt 129.0 lb

## 2018-01-31 DIAGNOSIS — R05 Cough: Secondary | ICD-10-CM | POA: Insufficient documentation

## 2018-01-31 DIAGNOSIS — R490 Dysphonia: Secondary | ICD-10-CM | POA: Diagnosis not present

## 2018-01-31 DIAGNOSIS — R053 Chronic cough: Secondary | ICD-10-CM

## 2018-01-31 MED ORDER — AMOXICILLIN 875 MG PO TABS: 875.0000 mg | ORAL_TABLET | Freq: Two times a day (BID) | ORAL | 0 refills | Status: DC

## 2018-01-31 NOTE — Patient Instructions (Signed)
Cough, Adult Coughing is a reflex that clears your throat and your airways. Coughing helps to heal and protect your lungs. It is normal to cough occasionally, but a cough that happens with other symptoms or lasts a long time may be a sign of a condition that needs treatment. A cough may last only 2-3 weeks (acute), or it may last longer than 8 weeks (chronic). What are the causes? Coughing is commonly caused by:  Breathing in substances that irritate your lungs.  A viral or bacterial respiratory infection.  Allergies.  Asthma.  Postnasal drip.  Smoking.  Acid backing up from the stomach into the esophagus (gastroesophageal reflux).  Certain medicines.  Chronic lung problems, including COPD (or rarely, lung cancer).  Other medical conditions such as heart failure.  Follow these instructions at home: Pay attention to any changes in your symptoms. Take these actions to help with your discomfort:  Take medicines only as told by your health care provider. ? If you were prescribed an antibiotic medicine, take it as told by your health care provider. Do not stop taking the antibiotic even if you start to feel better. ? Talk with your health care provider before you take a cough suppressant medicine.  Drink enough fluid to keep your urine clear or pale yellow.  If the air is dry, use a cold steam vaporizer or humidifier in your bedroom or your home to help loosen secretions.  Avoid anything that causes you to cough at work or at home.  If your cough is worse at night, try sleeping in a semi-upright position.  Avoid cigarette smoke. If you smoke, quit smoking. If you need help quitting, ask your health care provider.  Avoid caffeine.  Avoid alcohol.  Rest as needed.  Contact a health care provider if:  You have new symptoms.  You cough up pus.  Your cough does not get better after 2-3 weeks, or your cough gets worse.  You cannot control your cough with suppressant  medicines and you are losing sleep.  You develop pain that is getting worse or pain that is not controlled with pain medicines.  You have a fever.  You have unexplained weight loss.  You have night sweats. Get help right away if:  You cough up blood.  You have difficulty breathing.  Your heartbeat is very fast. This information is not intended to replace advice given to you by your health care provider. Make sure you discuss any questions you have with your health care provider. Document Released: 08/07/2010 Document Revised: 07/17/2015 Document Reviewed: 04/17/2014 Elsevier Interactive Patient Education  2018 [employer] Hoarseness Hoarseness is any abnormal change in your voice.Hoarseness can make it difficult to speak. Your voice may sound raspy, breathy, or strained. Hoarseness is caused by a problem with the vocal cords. The vocal cords are two bands of tissue inside your voice box (larynx). When you speak, your vocal cords move back and forth to create sound. The surfaces of your vocal cords need to be smooth for your voice to sound clear. Swelling or lumps on the vocal cords can cause hoarseness. Common causes of vocal cord problems include:  Upper airway infection.  A long-term cough.  Straining or overusing your voice.  Smoking.  Allergies.  Vocal cord growths.  Stomach acids that flow up from your stomach and irritate your vocal cords (gastroesophageal reflux).  Follow these instructions at home: Watch your condition for any changes. To ease any discomfort that you feel:  Rest your  voice. Do not whisper. Whispering can cause muscle strain.  Do not speak in a loud or harsh voice that makes your hoarseness worse.  Do not use any tobacco products, including cigarettes, chewing tobacco, or electronic cigarettes. If you need help quitting, ask your health care provider.  Avoid secondhand smoke.  Do not eat foods that give you heartburn. Heartburn can make  gastroesophageal reflux worse.  Do not drink coffee.  Do not drink alcohol.  Drink enough fluids to keep your urine clear or pale yellow.  Use a humidifier if the air in your home is dry.  Contact a health care provider if:  You have hoarseness that lasts longer than 3 weeks.  You almost lose or completelylose your voice for longer than 3 days.  You have pain when you swallow or try to talk.  You feel a lump in your neck. Get help right away if:  You have trouble swallowing.  You feel as though you are choking when you swallow.  You cough up blood or vomit blood.  You have trouble breathing. This information is not intended to replace advice given to you by your health care provider. Make sure you discuss any questions you have with your health care provider. Document Released: 01/22/2005 Document Revised: 07/17/2015 Document Reviewed: 01/30/2014 Elsevier Interactive Patient Education  [employer].

## 2018-01-31 NOTE — Progress Notes (Signed)
Patient: Katelyn Baker Female    DOB: Nov 14, 1945   72 y.o.   MRN: [id number] Visit Date: 01/31/2018  Today's Provider: Dortha Kernennis Chrismon, PA   Chief Complaint  Patient presents with  . Hoarse  . Cough   Subjective:    Cough  This is a chronic problem. The current episode started more than 1 year ago. The problem has been unchanged. The problem occurs every few minutes. The cough is productive of sputum. Associated symptoms include nasal congestion, rhinorrhea and wheezing. Pertinent negatives include no chest pain, chills, ear congestion, ear pain, fever, headaches, heartburn, hemoptysis, myalgias, postnasal drip, rash, sore throat, shortness of breath, sweats or weight loss. The symptoms are aggravated by exercise and other (heat). There is no history of bronchitis or pneumonia.    Patient states she has been hoarse for 4 months. Patient also states she has had a congested cough that is slightly productive. Patient states when she coughs, she feels a tightness in her chest. Patient also has symptoms of nasal congestion, runny nose and slight wheezing. Patient has been taking delsym.   Past Medical History:  Diagnosis Date  . GERD (gastroesophageal reflux disease)   . Hyperlipidemia   . Thyroid disease    Past Surgical History:  Procedure Laterality Date  . ABDOMINAL HYSTERECTOMY     partial  . bladder tact    . BREAST BIOPSY Left 1980'S   EXCISIONAL - NEG  . BREAST SURGERY  2000's   biopsy  . THYROIDECTOMY, PARTIAL  1990's   Family History  Problem Relation Age of Onset  . Alzheimer's disease Mother   . Diabetes Mother        ?  Marland Kitchen. Alcohol abuse Father   . Cancer Father        unknown cancer  . Diabetes Other   . Heart disease Other   . Cancer Other   . Colon polyps Other   . Breast cancer Neg Hx    Allergies  Allergen Reactions  . Bactrim [Sulfamethoxazole-Trimethoprim] Rash    Current Outpatient Medications:  .  Calcium Carbonate (CALCIUM 600 PO),  Take 600 mg by mouth daily. , Disp: , Rfl:  .  levothyroxine (SYNTHROID, LEVOTHROID) 100 MCG tablet, TAKE 1 TABLET BY MOUTH ONCE DAILY, Disp: 90 tablet, Rfl: 1 .  donepezil (ARICEPT) 5 MG tablet, TAKE 1 TABLET BY MOUTH AT BEDTIME, Disp: 90 tablet, Rfl: 1 .  lisinopril (PRINIVIL,ZESTRIL) 10 MG tablet, Take 1 tablet (10 mg total) by mouth daily., Disp: 90 tablet, Rfl: 3 .  Multiple Vitamin (MULTIVITAMIN) tablet, Take 1 tablet by mouth daily., Disp: , Rfl:  .  Omega-3 Fatty Acids (FISH OIL PO), Take by mouth., Disp: , Rfl:  .  sertraline (ZOLOFT) 50 MG tablet, Take 1 tablet (50 mg total) by mouth daily., Disp: 90 tablet, Rfl: 1 .  VESICARE 10 MG tablet, TAKE 1 TABLET BY MOUTH ONCE DAILY, Disp: 90 tablet, Rfl: 1 .  VITAMIN D, ERGOCALCIFEROL, PO, Take 2,000 Int'l Units by mouth daily. , Disp: , Rfl:   Review of Systems  Constitutional: Negative for appetite change, chills, fatigue, fever and weight loss.  HENT: Positive for rhinorrhea. Negative for ear pain, postnasal drip and sore throat.   Respiratory: Positive for cough and wheezing. Negative for hemoptysis, chest tightness and shortness of breath.   Cardiovascular: Negative for chest pain and palpitations.  Gastrointestinal: Negative for abdominal pain, heartburn, nausea and vomiting.  Musculoskeletal: Negative for myalgias.  Skin:  Negative for rash.  Neurological: Negative for dizziness, weakness and headaches.   Social History   Tobacco Use  . Smoking status: Passive Smoke Exposure - Never Smoker  . Smokeless tobacco: Never Used  Substance Use Topics  . Alcohol use: No   Objective:   BP (!) 150/84 (BP Location: Right Arm, Patient Position: Sitting, Cuff Size: Normal)   Pulse (!) 58   Temp 97.9 F (36.6 C) (Oral)   Resp 16   Wt 129 lb (58.5 kg)   SpO2 98%   BMI 22.85 kg/m  Vitals:   01/31/18 1456  BP: (!) 150/84  Pulse: (!) 58  Resp: 16  Temp: 97.9 F (36.6 C)  TempSrc: Oral  SpO2: 98%  Weight: 129 lb (58.5 kg)    Physical Exam  Constitutional: She is oriented to person, place, and time. She appears well-developed and well-nourished. No distress.  HENT:  Head: Normocephalic and atraumatic.  Right Ear: Hearing and external ear normal.  Left Ear: Hearing and external ear normal.  Nose: Nose normal.  Mouth/Throat: Oropharynx is clear and moist.  Eyes: Conjunctivae and lids are normal. Right eye exhibits no discharge. Left eye exhibits no discharge. No scleral icterus.  Cardiovascular: Normal rate, regular rhythm and normal heart sounds.  Pulmonary/Chest: Effort normal and breath sounds normal. No respiratory distress.  Musculoskeletal: Normal range of motion.  Neurological: She is alert and oriented to person, place, and time.  Skin: Skin is intact. No lesion and no rash noted.  Psychiatric: She has a normal mood and affect. Her speech is normal and behavior is normal. Thought content normal.      Assessment & Plan:     1. Hoarseness of voice States hoarseness with cough the past 3-4 months. Denies any personal tobacco use but husband is a smoker. Left maxillary sinus seems congested but no fever or teeth aching today. Denies dyspepsia but has a history of GERD. Should use use Pepcid if any develops. Gargle with warm saltwater and given Amoxil. Check CBC with diff for signs of infection. - CBC with Differential/Platelet - amoxicillin (AMOXIL) 875 MG tablet; Take 1 tablet (875 mg total) by mouth 2 (two) times daily.  Dispense: 20 tablet; Refill: 0  2. Persistent cough for 3 weeks or longer States she has had a cough intermittently for the past 3-4 months. Denies fever but some hoarseness today. Left maxillary sinus is cloudy to transilluminate but not tender. Denies chest pains or dyspnea, but feels chest is tight occasionally. Some yellow sputum production. Will get CBC and CXR to rule out infection or pulmonary lesion. Treat with antibiotic and Mucinex-DM for cough control. May need to be sure she  uses an antacid or Pepcid for any GERD that could cause a chronic cough. Recheck pending lab reports. - CBC with Differential/Platelet - DG Chest 2 View - amoxicillin (AMOXIL) 875 MG tablet; Take 1 tablet (875 mg total) by mouth 2 (two) times daily.  Dispense: 20 tablet; Refill: 0       Dortha Kern, PA  [facility]

## 2018-02-01 LAB — CBC WITH DIFFERENTIAL/PLATELET
Basophils Absolute: 0 10*3/uL (ref 0.0–0.2)
Basos: 1 %
EOS (ABSOLUTE): 0.1 10*3/uL (ref 0.0–0.4)
Eos: 2 %
Hematocrit: 38.6 % (ref 34.0–46.6)
Hemoglobin: 13 g/dL (ref 11.1–15.9)
Immature Grans (Abs): 0.1 10*3/uL (ref 0.0–0.1)
Immature Granulocytes: 1 %
Lymphocytes Absolute: 1 10*3/uL (ref 0.7–3.1)
Lymphs: 13 %
MCH: 31.6 pg (ref 26.6–33.0)
MCHC: 33.7 g/dL (ref 31.5–35.7)
MCV: 94 fL (ref 79–97)
Monocytes Absolute: 0.5 10*3/uL (ref 0.1–0.9)
Monocytes: 7 %
Neutrophils Absolute: 5.7 10*3/uL (ref 1.4–7.0)
Neutrophils: 76 %
Platelets: 209 10*3/uL (ref 150–450)
RBC: 4.12 x10E6/uL (ref 3.77–5.28)
RDW: 12.5 % (ref 12.3–15.4)
WBC: 7.4 10*3/uL (ref 3.4–10.8)

## 2018-03-22 ENCOUNTER — Other Ambulatory Visit: Payer: Self-pay | Admitting: Physician Assistant

## 2018-03-22 DIAGNOSIS — E890 Postprocedural hypothyroidism: Secondary | ICD-10-CM

## 2018-03-22 DIAGNOSIS — N3941 Urge incontinence: Secondary | ICD-10-CM

## 2018-04-07 ENCOUNTER — Ambulatory Visit: Payer: Medicare Other | Admitting: Physician Assistant

## 2018-04-07 ENCOUNTER — Encounter: Payer: Self-pay | Admitting: Physician Assistant

## 2018-04-07 VITALS — BP 148/80 | HR 60 | Temp 97.9°F | Resp 16 | Wt 130.8 lb

## 2018-04-07 DIAGNOSIS — F320 Major depressive disorder, single episode, mild: Secondary | ICD-10-CM

## 2018-04-07 DIAGNOSIS — G301 Alzheimer's disease with late onset: Secondary | ICD-10-CM

## 2018-04-07 DIAGNOSIS — E441 Mild protein-calorie malnutrition: Secondary | ICD-10-CM | POA: Insufficient documentation

## 2018-04-07 DIAGNOSIS — F0280 Dementia in other diseases classified elsewhere without behavioral disturbance: Secondary | ICD-10-CM

## 2018-04-07 DIAGNOSIS — I10 Essential (primary) hypertension: Secondary | ICD-10-CM

## 2018-04-07 MED ORDER — BUPROPION HCL ER (XL) 150 MG PO TB24: 150.0000 mg | ORAL_TABLET | Freq: Every day | ORAL | 1 refills | Status: DC

## 2018-04-07 NOTE — Patient Instructions (Signed)
Stop Sertraline. Start Bupropion/Wellbutrin Check BP once per week over next 4 weeks.   Bupropion extended-release tablets (Depression/Mood Disorders) What is this medicine? BUPROPION (byoo PROE pee on) is used to treat depression. This medicine may be used for other purposes; ask your health care provider or pharmacist if you have questions. COMMON BRAND NAME(S): Aplenzin, Budeprion XL, Forfivo XL, Wellbutrin XL What should I tell my health care provider before I take this medicine? They need to know if you have any of these conditions: -an eating disorder, such as anorexia or bulimia -bipolar disorder or psychosis -diabetes or high blood sugar, treated with medication -glaucoma -head injury or brain tumor -heart disease, previous heart attack, or irregular heart beat -high blood pressure -kidney or liver disease -seizures (convulsions) -suicidal thoughts or a previous suicide attempt -Tourette's syndrome -weight loss -an unusual or allergic reaction to bupropion, other medicines, foods, dyes, or preservatives -breast-feeding -pregnant or trying to become pregnant How should I use this medicine? Take this medicine by mouth with a glass of water. Follow the directions on the prescription label. You can take it with or without food. If it upsets your stomach, take it with food. Do not crush, chew, or cut these tablets. This medicine is taken once daily at the same time each day. Do not take your medicine more often than directed. Do not stop taking this medicine suddenly except upon the advice of your doctor. Stopping this medicine too quickly may cause serious side effects or your condition may worsen. A special MedGuide will be given to you by the pharmacist with each prescription and refill. Be sure to read this information carefully each time. Talk to your pediatrician regarding the use of this medicine in children. Special care may be needed. Overdosage: If you think you have taken  too much of this medicine contact a poison control center or emergency room at once. NOTE: This medicine is only for you. Do not share this medicine with others. What if I miss a dose? If you miss a dose, skip the missed dose and take your next tablet at the regular time. Do not take double or extra doses. What may interact with this medicine? Do not take this medicine with any of the following medications: -linezolid -MAOIs like Azilect, Carbex, Eldepryl, Marplan, Nardil, and Parnate -methylene blue (injected into a vein) -other medicines that contain bupropion like Zyban This medicine may also interact with the following medications: -alcohol -certain medicines for anxiety or sleep -certain medicines for blood pressure like metoprolol, propranolol -certain medicines for depression or psychotic disturbances -certain medicines for HIV or AIDS like efavirenz, lopinavir, nelfinavir, ritonavir -certain medicines for irregular heart beat like propafenone, flecainide -certain medicines for Parkinson's disease like amantadine, levodopa -certain medicines for seizures like carbamazepine, phenytoin, phenobarbital -cimetidine -clopidogrel -cyclophosphamide -digoxin -furazolidone -isoniazid -nicotine -orphenadrine -procarbazine -steroid medicines like prednisone or cortisone -stimulant medicines for attention disorders, weight loss, or to stay awake -tamoxifen -theophylline -thiotepa -ticlopidine -tramadol -warfarin This list may not describe all possible interactions. Give your health care provider a list of all the medicines, herbs, non-prescription drugs, or dietary supplements you use. Also tell them if you smoke, drink alcohol, or use illegal drugs. Some items may interact with your medicine. What should I watch for while using this medicine? Tell your doctor if your symptoms do not get better or if they get worse. Visit your doctor or health care professional for regular checks on  your progress. Because it may take several weeks to  see the full effects of this medicine, it is important to continue your treatment as prescribed by your doctor. Patients and their families should watch out for new or worsening thoughts of suicide or depression. Also watch out for sudden changes in feelings such as feeling anxious, agitated, panicky, irritable, hostile, aggressive, impulsive, severely restless, overly excited and hyperactive, or not being able to sleep. If this happens, especially at the beginning of treatment or after a change in dose, call your health care professional. Avoid alcoholic drinks while taking this medicine. Drinking large amounts of alcoholic beverages, using sleeping or anxiety medicines, or quickly stopping the use of these agents while taking this medicine may increase your risk for a seizure. Do not drive or use heavy machinery until you know how this medicine affects you. This medicine can impair your ability to perform these tasks. Do not take this medicine close to bedtime. It may prevent you from sleeping. Your mouth may get dry. Chewing sugarless gum or sucking hard candy, and drinking plenty of water may help. Contact your doctor if the problem does not go away or is severe. The tablet shell for some brands of this medicine does not dissolve. This is normal. The tablet shell may appear whole in the stool. This is not a cause for concern. What side effects may I notice from receiving this medicine? Side effects that you should report to your doctor or health care professional as soon as possible: -allergic reactions like skin rash, itching or hives, swelling of the face, lips, or tongue -breathing problems -changes in vision -confusion -elevated mood, decreased need for sleep, racing thoughts, impulsive behavior -fast or irregular heartbeat -hallucinations, loss of contact with reality -increased blood pressure -redness, blistering, peeling or loosening of  the skin, including inside the mouth -seizures -suicidal thoughts or other mood changes -unusually weak or tired -vomiting Side effects that usually do not require medical attention (report to your doctor or health care professional if they continue or are bothersome): -constipation -headache -loss of appetite -nausea -tremors -weight loss This list may not describe all possible side effects. Call your doctor for medical advice about side effects. You may report side effects to FDA at 1-800-FDA-1088. Where should I keep my medicine? Keep out of the reach of children. Store at room temperature between 15 and 30 degrees C (59 and 86 degrees F). Throw away any unused medicine after the expiration date. NOTE: This sheet is a summary. It may not cover all possible information. If you have questions about this medicine, talk to your doctor, pharmacist, or health care provider.  2019 Elsevier/Gold Standard (2015-08-01 13:55:13)

## 2018-04-07 NOTE — Progress Notes (Signed)
Patient: Katelyn Baker Female    DOB: Jul 21, 1945   73 y.o.   MRN: [id number] Visit Date: 04/07/2018  Today's Provider: [name], PA-C   Chief Complaint  Patient presents with  . Follow-up   Subjective:     HPI  Patient here today for three month follow-up Hypertension. Patient was given Lisinopril 10 mg. Patient reports that she is not having side effects. Patient does not check her blood pressure. Her diet is well balanced. Denies chest pain.sob, visual disturbances,headache.  BP Readings from Last 3 Encounters:  04/07/18 (!) 148/80  01/31/18 (!) 150/84  01/04/18 (!) 160/80    Depression: Patient stable on Sertraline. She reports that she doesn't have any energy, but she keeps her grandchildren. She is taking sertraline in the morning because "it is easier to remember to take."  Allergies  Allergen Reactions  . Bactrim [Sulfamethoxazole-Trimethoprim] Rash     Current Outpatient Medications:  .  Calcium Carbonate (CALCIUM 600 PO), Take 600 mg by mouth daily. , Disp: , Rfl:  .  donepezil (ARICEPT) 5 MG tablet, TAKE 1 TABLET BY MOUTH AT BEDTIME, Disp: 90 tablet, Rfl: 1 .  levothyroxine (SYNTHROID, LEVOTHROID) 100 MCG tablet, TAKE 1 TABLET BY MOUTH ONCE DAILY, Disp: 90 tablet, Rfl: 1 .  lisinopril (PRINIVIL,ZESTRIL) 10 MG tablet, Take 1 tablet (10 mg total) by mouth daily., Disp: 90 tablet, Rfl: 3 .  Multiple Vitamin (MULTIVITAMIN) tablet, Take 1 tablet by mouth daily., Disp: , Rfl:  .  Omega-3 Fatty Acids (FISH OIL PO), Take by mouth., Disp: , Rfl:  .  sertraline (ZOLOFT) 50 MG tablet, Take 1 tablet (50 mg total) by mouth daily., Disp: 90 tablet, Rfl: 1 .  solifenacin (VESICARE) 10 MG tablet, TAKE 1 TABLET BY MOUTH ONCE DAILY, Disp: 90 tablet, Rfl: 1 .  VITAMIN D, ERGOCALCIFEROL, PO, Take 2,000 Int'l Units by mouth daily. , Disp: , Rfl:   Review of Systems  Constitutional: Positive for fatigue.  Eyes: Negative for visual disturbance.  Respiratory:  Negative.   Cardiovascular: Negative.   Neurological: Negative.   Psychiatric/Behavioral: Positive for dysphoric mood. Negative for agitation, decreased concentration and sleep disturbance. The patient is not nervous/anxious.     Social History   Tobacco Use  . Smoking status: Passive Smoke Exposure - Never Smoker  . Smokeless tobacco: Never Used  Substance Use Topics  . Alcohol use: No      Objective:   BP (!) 148/80 (BP Location: Left Arm, Patient Position: Sitting, Cuff Size: Normal)   Pulse 60   Temp 97.9 F (36.6 C) (Oral)   Resp 16   Wt 130 lb 12.8 oz (59.3 kg)   BMI 23.17 kg/m  Vitals:   04/07/18 0859  BP: (!) 148/80  Pulse: 60  Resp: 16  Temp: 97.9 F (36.6 C)  TempSrc: Oral  Weight: 130 lb 12.8 oz (59.3 kg)     Physical Exam Vitals signs reviewed.  Constitutional:      General: She is not in acute distress.    Appearance: She is well-developed. She is not ill-appearing or diaphoretic.  Neck:     Musculoskeletal: Normal range of motion and neck supple.  Cardiovascular:     Rate and Rhythm: Normal rate and regular rhythm.     Heart sounds: Normal heart sounds. No murmur. No friction rub. No gallop.   Pulmonary:     Effort: Pulmonary effort is normal. No respiratory distress.     Breath sounds:  Normal breath sounds. No wheezing or rales.  Psychiatric:        Mood and Affect: Mood normal.        Speech: Speech normal.        Behavior: Behavior normal.       Office Visit from 04/07/2018 in [facility]  PHQ-9 Total Score  8        Assessment & Plan    1. Depression, major, single episode, mild (HCC) Change treatment from sertraline to wellbutrin as below. I will see her back in 4 weeks to recheck. - buPROPion (WELLBUTRIN XL) 150 MG 24 hr tablet; Take 1 tablet (150 mg total) by mouth daily.  Dispense: 30 tablet; Refill: 1  2. Late onset Alzheimer's disease without behavioral disturbance (HCC) Stable.   3. Mild protein-calorie  malnutrition (HCC) Weight is stable. Will monitor with starting wellbutrin which can cause some weight loss.   4. Essential hypertension Stable. Continue lisinopril 10mg .      [name], PA-C  [facility]

## 2018-05-05 ENCOUNTER — Ambulatory Visit: Payer: Medicare Other | Admitting: Physician Assistant

## 2018-05-05 ENCOUNTER — Encounter: Payer: Self-pay | Admitting: Physician Assistant

## 2018-05-05 ENCOUNTER — Other Ambulatory Visit: Payer: Self-pay

## 2018-05-05 VITALS — BP 141/79 | HR 57 | Temp 97.9°F | Resp 16 | Wt 131.0 lb

## 2018-05-05 DIAGNOSIS — I10 Essential (primary) hypertension: Secondary | ICD-10-CM

## 2018-05-05 DIAGNOSIS — H538 Other visual disturbances: Secondary | ICD-10-CM | POA: Diagnosis not present

## 2018-05-05 NOTE — Patient Instructions (Signed)
Call for eye appointment for blurry vision  DASH Eating Plan DASH stands for "Dietary Approaches to Stop Hypertension." The DASH eating plan is a healthy eating plan that has been shown to reduce high blood pressure (hypertension). It may also reduce your risk for type 2 diabetes, heart disease, and stroke. The DASH eating plan may also help with weight loss. What are tips for following this plan?  General guidelines  Avoid eating more than 2,300 mg (milligrams) of salt (sodium) a day. If you have hypertension, you may need to reduce your sodium intake to 1,500 mg a day.  Limit alcohol intake to no more than 1 drink a day for nonpregnant women and 2 drinks a day for men. One drink equals 12 oz of beer, 5 oz of wine, or 1 oz of hard liquor.  Work with your health care provider to maintain a healthy body weight or to lose weight. Ask what an ideal weight is for you.  Get at least 30 minutes of exercise that causes your heart to beat faster (aerobic exercise) most days of the week. Activities may include walking, swimming, or biking.  Work with your health care provider or diet and nutrition specialist (dietitian) to adjust your eating plan to your individual calorie needs. Reading food labels   Check food labels for the amount of sodium per serving. Choose foods with less than 5 percent of the Daily Value of sodium. Generally, foods with less than 300 mg of sodium per serving fit into this eating plan.  To find whole grains, look for the word "whole" as the first word in the ingredient list. Shopping  Buy products labeled as "low-sodium" or "no salt added."  Buy fresh foods. Avoid canned foods and premade or frozen meals. Cooking  Avoid adding salt when cooking. Use salt-free seasonings or herbs instead of table salt or sea salt. Check with your health care provider or pharmacist before using salt substitutes.  Do not fry foods. Cook foods using healthy methods such as baking, boiling,  grilling, and broiling instead.  Cook with heart-healthy oils, such as olive, canola, soybean, or sunflower oil. Meal planning  Eat a balanced diet that includes: ? 5 or more servings of fruits and vegetables each day. At each meal, try to fill half of your plate with fruits and vegetables. ? Up to 6-8 servings of whole grains each day. ? Less than 6 oz of lean meat, poultry, or fish each day. A 3-oz serving of meat is about the same size as a deck of cards. One egg equals 1 oz. ? 2 servings of low-fat dairy each day. ? A serving of nuts, seeds, or beans 5 times each week. ? Heart-healthy fats. Healthy fats called Omega-3 fatty acids are found in foods such as flaxseeds and coldwater fish, like sardines, salmon, and mackerel.  Limit how much you eat of the following: ? Canned or prepackaged foods. ? Food that is high in trans fat, such as fried foods. ? Food that is high in saturated fat, such as fatty meat. ? Sweets, desserts, sugary drinks, and other foods with added sugar. ? Full-fat dairy products.  Do not salt foods before eating.  Try to eat at least 2 vegetarian meals each week.  Eat more home-cooked food and less restaurant, buffet, and fast food.  When eating at a restaurant, ask that your food be prepared with less salt or no salt, if possible. What foods are recommended? The items listed may not be  a complete list. Talk with your dietitian about what dietary choices are best for you. Grains Whole-grain or whole-wheat bread. Whole-grain or whole-wheat pasta. Brown rice. [place]. Bulgur. Whole-grain and low-sodium cereals. Pita bread. Low-fat, low-sodium crackers. Whole-wheat flour tortillas. Vegetables Fresh or frozen vegetables (raw, steamed, roasted, or grilled). Low-sodium or reduced-sodium tomato and vegetable juice. Low-sodium or reduced-sodium tomato sauce and tomato paste. Low-sodium or reduced-sodium canned vegetables. Fruits All fresh, dried, or frozen  fruit. Canned fruit in natural juice (without added sugar). Meat and other protein foods Skinless chicken or [place]. Ground chicken or [place]. Pork with fat trimmed off. Fish and seafood. Egg whites. Dried beans, peas, or lentils. Unsalted nuts, nut butters, and seeds. Unsalted canned beans. Lean cuts of beef with fat trimmed off. Low-sodium, lean deli meat. Dairy Low-fat (1%) or fat-free (skim) milk. Fat-free, low-fat, or reduced-fat cheeses. Nonfat, low-sodium ricotta or cottage cheese. Low-fat or nonfat yogurt. Low-fat, low-sodium cheese. Fats and oils Soft margarine without trans fats. Vegetable oil. Low-fat, reduced-fat, or light mayonnaise and salad dressings (reduced-sodium). Canola, safflower, olive, soybean, and sunflower oils. Avocado. Seasoning and other foods Herbs. Spices. Seasoning mixes without salt. Unsalted popcorn and pretzels. Fat-free sweets. What foods are not recommended? The items listed may not be a complete list. Talk with your dietitian about what dietary choices are best for you. Grains Baked goods made with fat, such as croissants, muffins, or some breads. Dry pasta or rice meal packs. Vegetables Creamed or fried vegetables. Vegetables in a cheese sauce. Regular canned vegetables (not low-sodium or reduced-sodium). Regular canned tomato sauce and paste (not low-sodium or reduced-sodium). Regular tomato and vegetable juice (not low-sodium or reduced-sodium). Angie Fava. Olives. Fruits Canned fruit in a light or heavy syrup. Fried fruit. Fruit in cream or butter sauce. Meat and other protein foods Fatty cuts of meat. Ribs. Fried meat. Berniece Salines. Sausage. Bologna and other processed lunch meats. Salami. Fatback. Hotdogs. Bratwurst. Salted nuts and seeds. Canned beans with added salt. Canned or smoked fish. Whole eggs or egg yolks. Chicken or [place] with skin. Dairy Whole or 2% milk, cream, and half-and-half. Whole or full-fat cream cheese. Whole-fat or sweetened yogurt. Full-fat  cheese. Nondairy creamers. Whipped toppings. Processed cheese and cheese spreads. Fats and oils Butter. Stick margarine. Lard. Shortening. Ghee. Bacon fat. Tropical oils, such as coconut, palm kernel, or palm oil. Seasoning and other foods Salted popcorn and pretzels. Onion salt, garlic salt, seasoned salt, table salt, and sea salt. Worcestershire sauce. Tartar sauce. Barbecue sauce. Teriyaki sauce. Soy sauce, including reduced-sodium. Steak sauce. Canned and packaged gravies. Fish sauce. Oyster sauce. Cocktail sauce. Horseradish that you find on the shelf. Ketchup. Mustard. Meat flavorings and tenderizers. Bouillon cubes. Hot sauce and Tabasco sauce. Premade or packaged marinades. Premade or packaged taco seasonings. Relishes. Regular salad dressings. Where to find more information:  National Heart, Lung, and [place]: https://Bahen[place]-eaton.com/  American Heart Association: www.heart.org Summary  The DASH eating plan is a healthy eating plan that has been shown to reduce high blood pressure (hypertension). It may also reduce your risk for type 2 diabetes, heart disease, and stroke.  With the DASH eating plan, you should limit salt (sodium) intake to 2,300 mg a day. If you have hypertension, you may need to reduce your sodium intake to 1,500 mg a day.  When on the DASH eating plan, aim to eat more fresh fruits and vegetables, whole grains, lean proteins, low-fat dairy, and heart-healthy fats.  Work with your health care provider or diet and nutrition specialist (  dietitian) to adjust your eating plan to your individual calorie needs. This information is not intended to replace advice given to you by your health care provider. Make sure you discuss any questions you have with your health care provider. Document Released: 01/28/2011 Document Revised: 02/02/2016 Document Reviewed: 02/02/2016 Elsevier Interactive Patient Education  2019 [employer].

## 2018-05-05 NOTE — Progress Notes (Signed)
Patient: Katelyn Baker Female    DOB: [birth year]   73 y.o.   MRN: [id number] Visit Date: 05/05/2018  Today's Provider: [name], PA-C   Chief Complaint  Patient presents with  . Depression  . Hypertension   Subjective:    I, Sulibeya S. Dimas, CMA, am acting as a Neurosurgeon for [employer], PA-C.  HPI  Follow up for Depression  The patient was last seen for this 1 months ago. Changes made at last visit include change from sertraline to wellbutin.  She reports excellent compliance with treatment. She feels that condition is Unchanged. She is not having side effects.   ------------------------------------------------------------------------------------  Follow up for hypertension  The patient was last seen for this 1 months ago. Changes made at last visit include continue current medication.  She reports excellent compliance with treatment. She feels that condition is Unchanged. She is not having side effects.   BP Readings from Last 3 Encounters:  05/05/18 (!) 141/79  04/07/18 (!) 148/80  01/31/18 (!) 150/84   ------------------------------------------------------------------------------------    Allergies  Allergen Reactions  . Bactrim [Sulfamethoxazole-Trimethoprim] Rash     Current Outpatient Medications:  .  buPROPion (WELLBUTRIN XL) 150 MG 24 hr tablet, Take 1 tablet (150 mg total) by mouth daily., Disp: 30 tablet, Rfl: 1 .  Calcium Carbonate (CALCIUM 600 PO), Take 600 mg by mouth daily. , Disp: , Rfl:  .  donepezil (ARICEPT) 5 MG tablet, TAKE 1 TABLET BY MOUTH AT BEDTIME, Disp: 90 tablet, Rfl: 1 .  lisinopril (PRINIVIL,ZESTRIL) 10 MG tablet, Take 1 tablet (10 mg total) by mouth daily., Disp: 90 tablet, Rfl: 3 .  Multiple Vitamin (MULTIVITAMIN) tablet, Take 1 tablet by mouth daily., Disp: , Rfl:  .  Omega-3 Fatty Acids (FISH OIL PO), Take by mouth., Disp: , Rfl:  .  solifenacin (VESICARE) 10 MG tablet, TAKE 1 TABLET BY MOUTH ONCE  DAILY, Disp: 90 tablet, Rfl: 1 .  VITAMIN D, ERGOCALCIFEROL, PO, Take 2,000 Int'l Units by mouth daily. , Disp: , Rfl:  .  levothyroxine (SYNTHROID, LEVOTHROID) 100 MCG tablet, TAKE 1 TABLET BY MOUTH ONCE DAILY (Patient not taking: Reported on 05/05/2018), Disp: 90 tablet, Rfl: 1  Review of Systems  Constitutional: Negative.   Respiratory: Negative.   Cardiovascular: Negative.   Psychiatric/Behavioral: The patient is nervous/anxious.     Social History   Tobacco Use  . Smoking status: Passive Smoke Exposure - Never Smoker  . Smokeless tobacco: Never Used  Substance Use Topics  . Alcohol use: No      Objective:   BP (!) 141/79 (BP Location: Left Arm, Patient Position: Sitting, Cuff Size: Large)   Pulse (!) 57   Temp 97.9 F (36.6 C) (Oral)   Resp 16   Wt 131 lb (59.4 kg)   BMI 23.21 kg/m  Vitals:   05/05/18 0943  BP: (!) 141/79  Pulse: (!) 57  Resp: 16  Temp: 97.9 F (36.6 C)  TempSrc: Oral  Weight: 131 lb (59.4 kg)     Physical Exam Vitals signs reviewed.  Constitutional:      General: She is not in acute distress.    Appearance: Normal appearance. She is well-developed and normal weight. She is not diaphoretic.  HENT:     Head: Normocephalic and atraumatic.     Mouth/Throat:     Mouth: Mucous membranes are moist.     Pharynx: No posterior oropharyngeal erythema.  Eyes:     Extraocular  Movements: Extraocular movements intact.     Pupils: Pupils are equal, round, and reactive to light.  Neck:     Musculoskeletal: Normal range of motion and neck supple.  Cardiovascular:     Rate and Rhythm: Normal rate and regular rhythm.     Heart sounds: Normal heart sounds. No murmur. No friction rub. No gallop.   Pulmonary:     Effort: Pulmonary effort is normal. No respiratory distress.     Breath sounds: Normal breath sounds. No wheezing or rales.  Neurological:     General: No focal deficit present.     Mental Status: She is alert. Mental status is at baseline.         Assessment & Plan    1. Essential hypertension Stable. Continue lisinopril 10mg . I will see her back in 4 months for CPE.   2. Blurred vision, bilateral Discussed getting ophthamology exam. Call if something found that needs to be addressed.      [name], PA-C  [facility]

## 2018-08-07 ENCOUNTER — Ambulatory Visit: Payer: Self-pay | Admitting: Physician Assistant

## 2018-08-15 ENCOUNTER — Other Ambulatory Visit: Payer: Self-pay

## 2018-08-15 ENCOUNTER — Ambulatory Visit (INDEPENDENT_AMBULATORY_CARE_PROVIDER_SITE_OTHER): Payer: Medicare Other

## 2018-08-15 ENCOUNTER — Ambulatory Visit (INDEPENDENT_AMBULATORY_CARE_PROVIDER_SITE_OTHER): Payer: Medicare Other | Admitting: Family Medicine

## 2018-08-15 DIAGNOSIS — F0280 Dementia in other diseases classified elsewhere without behavioral disturbance: Secondary | ICD-10-CM | POA: Diagnosis not present

## 2018-08-15 DIAGNOSIS — G301 Alzheimer's disease with late onset: Secondary | ICD-10-CM | POA: Diagnosis not present

## 2018-08-15 DIAGNOSIS — R05 Cough: Secondary | ICD-10-CM | POA: Diagnosis not present

## 2018-08-15 DIAGNOSIS — Z0000 Encounter for general adult medical examination without abnormal findings: Secondary | ICD-10-CM

## 2018-08-15 DIAGNOSIS — R059 Cough, unspecified: Secondary | ICD-10-CM

## 2018-08-15 NOTE — Progress Notes (Signed)
Subjective:   Katelyn Baker is a 73 y.o. female who presents for Medicare Annual (Subsequent) preventive examination.    This visit is being conducted through telemedicine due to the COVID-19 pandemic. This patient has given me verbal consent via doximity to conduct this visit, patient states they are participating from their home address. Some vital signs may be absent or patient reported.    Patient identification: identified by name, DOB, and current address  Review of Systems:  N/A  Cardiac Risk Factors include: advanced age (>355men, 54>65 women);hypertension     Objective:     Vitals: There were no vitals taken for this visit.  There is no height or weight on file to calculate BMI. Unable to obtain vitals due to visit being conducted via telephonically.   Advanced Directives 08/15/2018 08/11/2017 04/29/2016 04/29/2015 11/21/2014 08/27/2014  Does Patient Have a Medical Advance Directive? Yes No No No No No  Type of Estate agentAdvance Directive Healthcare Power of [place];Living will - - - - -  Copy of Healthcare Power of Attorney in Chart? No - copy requested - - - - -  Would patient like information on creating a medical advance directive? - Yes (MAU/Ambulatory/Procedural Areas - Information given) Yes (ED - Information included in AVS) - - -    Tobacco Social History   Tobacco Use  Smoking Status Passive Smoke Exposure - Never Smoker  Smokeless Tobacco Never Used     Counseling given: Not Answered   Clinical Intake:  Pre-visit preparation completed: Yes  Pain Score: 0-No pain     Nutritional Risks: None Diabetes: No  How often do you need to have someone help you when you read instructions, pamphlets, or other written materials from your doctor or pharmacy?: 1 - Never  Interpreter Needed?: No  Information entered by :: [facility] CenterMmarkoski, LPN  Past Medical History:  Diagnosis Date  . GERD (gastroesophageal reflux disease)   . Hyperlipidemia   . Thyroid disease    Past  Surgical History:  Procedure Laterality Date  . ABDOMINAL HYSTERECTOMY     partial  . bladder tact    . BREAST BIOPSY Left 1980'S   EXCISIONAL - NEG  . BREAST SURGERY  2000's   biopsy  . THYROIDECTOMY, PARTIAL  1990's   Family History  Problem Relation Age of Onset  . Alzheimer's disease Mother   . Diabetes Mother        ?  Marland Kitchen. Alcohol abuse Father   . Cancer Father        unknown cancer  . Diabetes Other   . Heart disease Other   . Cancer Other   . Colon polyps Other   . Breast cancer Neg Hx    Social History   Socioeconomic History  . Marital status: Married    Spouse name: Katelyn RuizJohn  . Number of children: 2  . Years of education: 4813  . Highest education level: Some college, no degree  Occupational History  . Occupation: Retired  Engineer, productionocial Needs  . Financial resource strain: Not hard at all  . Food insecurity    Worry: Never true    Inability: Never true  . Transportation needs    Medical: No    Non-medical: No  Tobacco Use  . Smoking status: Passive Smoke Exposure - Never Smoker  . Smokeless tobacco: Never Used  Substance and Sexual Activity  . Alcohol use: No  . Drug use: No  . Sexual activity: Not Currently  Lifestyle  . Physical activity  Days per week: 0 days    Minutes per session: 0 min  . Stress: Only a little  Relationships  . Social Musicianconnections    Talks on phone: Patient refused    Gets together: Patient refused    Attends religious service: Patient refused    Active member of club or organization: Patient refused    Attends meetings of clubs or organizations: Patient refused    Relationship status: Patient refused  Other Topics Concern  . Not on file  Social History Narrative  . Not on file    Outpatient Encounter Medications as of 08/15/2018  Medication Sig  . buPROPion (WELLBUTRIN XL) 150 MG 24 hr tablet Take 1 tablet (150 mg total) by mouth daily.  . Calcium Carbonate (CALCIUM 600 PO) Take 600 mg by mouth daily.   Marland Kitchen. donepezil (ARICEPT) 5  MG tablet TAKE 1 TABLET BY MOUTH AT BEDTIME  . levothyroxine (SYNTHROID, LEVOTHROID) 100 MCG tablet TAKE 1 TABLET BY MOUTH ONCE DAILY  . lisinopril (PRINIVIL,ZESTRIL) 10 MG tablet Take 1 tablet (10 mg total) by mouth daily.  . Multiple Vitamin (MULTIVITAMIN) tablet Take 1 tablet by mouth daily.  . Omega-3 Fatty Acids (FISH OIL PO) Take by mouth daily.   . solifenacin (VESICARE) 10 MG tablet TAKE 1 TABLET BY MOUTH ONCE DAILY  . VITAMIN D, ERGOCALCIFEROL, PO Take 2,000 Int'l Units by mouth daily.    No facility-administered encounter medications on file as of 08/15/2018.     Activities of Daily Living In your present state of health, do you have any difficulty performing the following activities: 08/15/2018  Hearing? N  Vision? N  Comment Wears eye glasses.  Difficulty concentrating or making decisions? N  Walking or climbing stairs? N  Dressing or bathing? N  Doing errands, shopping? N  Preparing Food and eating ? N  Using the Toilet? N  In the past six months, have you accidently leaked urine? Y  Comment Occasionally- wears protection as needed.  Do you have problems with loss of bowel control? N  Managing your Medications? N  Managing your Finances? N  Housekeeping or managing your Housekeeping? N  Some recent data might be hidden    Patient Care Team: Margaretann LovelessBurnette, Jennifer M, PA-C as PCP - General (Family Medicine) Alfredo MartinezMacDiarmid, Scott, MD as Consulting Physician (Urology)    Assessment:   This is a routine wellness examination for Katelyn Baker.  Exercise Activities and Dietary recommendations Current Exercise Habits: The patient does not participate in regular exercise at present, Exercise limited by: None identified  Goals    . DIET - INCREASE WATER INTAKE     Recommend increasing water intake to 3-4 glasses a day.     . Increase water intake     Recommend increasing water intake to 3 glasses a day.       Fall Risk: Fall Risk  08/15/2018 08/11/2017 04/29/2017 04/29/2016 04/29/2015   Falls in the past year? 0 No No No No    FALL RISK PREVENTION PERTAINING TO THE HOME:  Any stairs in or around the home? Yes  If so, are there any without handrails? Yes  Home free of loose throw rugs in walkways, pet beds, electrical cords, etc? Yes  Adequate lighting in your home to reduce risk of falls? Yes   ASSISTIVE DEVICES UTILIZED TO PREVENT FALLS:  Life alert? No  Use of a cane, walker or w/c? No  Grab bars in the bathroom? No  Shower chair or bench in shower? No  Elevated toilet seat or a handicapped toilet? No    TIMED UP AND GO:  Was the test performed? No .    Depression Screen PHQ 2/9 Scores 08/15/2018 04/07/2018 01/04/2018 09/30/2017  PHQ - 2 Score 0 3 1 2   PHQ- 9 Score - 8 4 10      Cognitive Function: Declined today.   MMSE - Mini Mental State Exam 04/29/2017 01/21/2017  Orientation to time 2 1  Orientation to Place 4 5  Registration 3 3  Attention/ Calculation 5 5  Recall 2 2  Language- name 2 objects 2 2  Language- repeat 1 1  Language- follow 3 step command 3 3  Language- read & follow direction 1 1  Write a sentence 1 1  Copy design 1 0  Total score 25 24     6CIT Screen 04/29/2016  What Year? 0 points  What month? 0 points  What time? 0 points  Count back from 20 0 points  Months in reverse 0 points  Repeat phrase 6 points  Total Score 6    Immunization History  Administered Date(s) Administered  . Influenza Split 12/02/2011  . Influenza, High Dose Seasonal PF 11/21/2014, 04/29/2016, 01/04/2018  . Pneumococcal Conjugate-13 09/30/2013  . Pneumococcal Polysaccharide-23 12/02/2011  . Td 08/18/2016  . Tdap 12/16/2005  . Zoster 09/30/2013    Qualifies for Shingles Vaccine? Yes  Zostavax completed 09/30/13. Due for Shingrix. Education has been provided regarding the importance of this vaccine. Pt has been advised to call insurance company to determine out of pocket expense. Advised may also receive vaccine at local pharmacy or Health Dept.  Verbalized acceptance and understanding.  Tdap: Up to date  Flu Vaccine: Up to date  Pneumococcal Vaccine: Completed series   Screening Tests Health Maintenance  Topic Date Due  . MAMMOGRAM  09/11/2017  . INFLUENZA VACCINE  09/23/2018  . COLONOSCOPY  09/05/2019  . TETANUS/TDAP  08/19/2026  . DEXA SCAN  Completed  . Hepatitis C Screening  Completed  . PNA vac Low Risk Adult  Completed    Cancer Screenings:  Colorectal Screening: Completed 09/04/09. Repeat every 10 years.  Mammogram: Ordered 09/02/17. Pt to follow up on scheduling this apt this year.   Bone Density: Completed 12/16/14. Results reflect NORMAL. No repeat needed.  Lung Cancer Screening: (Low Dose CT Chest recommended if Age 73-80 years, 30 pack-year currently smoking OR have quit w/in 15years.) does not qualify.   Additional Screening:  Hepatitis C Screening: Up to date  Vision Screening: Recommended annual ophthalmology exams for early detection of glaucoma and other disorders of the eye.  Dental Screening: Recommended annual dental exams for proper oral hygiene  Community Resource Referral:  CRR required this visit?  No       Plan:  I have personally reviewed and addressed the Medicare Annual Wellness questionnaire and have noted the following in the patient's chart:  A. Medical and social history B. Use of alcohol, tobacco or illicit drugs  C. Current medications and supplements D. Functional ability and status E.  Nutritional status F.  Physical activity G. Advance directives H. List of other physicians I.  Hospitalizations, surgeries, and ER visits in previous 12 months J.  [place] such as hearing and vision if needed, cognitive and depression L. Referrals and appointments   In addition, I have reviewed and discussed with patient certain preventive protocols, quality metrics, and best practice recommendations. A written personalized care plan for preventive services as well as  general  preventive health recommendations were provided to patient. Nurse Health Advisor  Signed,    Tiburcio Linder Apple ValleyMarkoski, CaliforniaLPN  4/09/81196/23/2020 Nurse Health Advisor   Nurse Notes: Pt to f/u on mammogram order.

## 2018-08-15 NOTE — Patient Instructions (Signed)
Katelyn Baker , Thank you for taking time to come for your Medicare Wellness Visit. I appreciate your ongoing commitment to your health goals. Please review the following plan we discussed and let me know if I can assist you in the future.   Screening recommendations/referrals: Colonoscopy: Up to date, due 08/2019 Mammogram: Ordered 09/02/17. Pt to f/u on scheduling this apt.  Bone Density: Up to date, previous DEXA scan was normal. No repeat needed.  Recommended yearly ophthalmology/optometry visit for glaucoma screening and checkup Recommended yearly dental visit for hygiene and checkup  Vaccinations: Influenza vaccine: Up to date Pneumococcal vaccine: Completed series Tdap vaccine: Up to date, due 07/2026 Shingles vaccine: Pt declines today.     Advanced directives: Please bring a copy of your POA (Power of Attorney) and/or Living Will to your next appointment.   Conditions/risks identified: Continue to increase water intake to 6-8 8 oz glasses a day.   Next appointment: 09/06/18 @ 2:40 PM with Fenton Malling.    Preventive Care 72 Years and Older, Female Preventive care refers to lifestyle choices and visits with your health care provider that can promote health and wellness. What does preventive care include?  A yearly physical exam. This is also called an annual well check.  Dental exams once or twice a year.  Routine eye exams. Ask your health care provider how often you should have your eyes checked.  Personal lifestyle choices, including:  Daily care of your teeth and gums.  Regular physical activity.  Eating a healthy diet.  Avoiding tobacco and drug use.  Limiting alcohol use.  Practicing safe sex.  Taking low-dose aspirin every day.  Taking vitamin and mineral supplements as recommended by your health care provider. What happens during an annual well check? The services and screenings done by your health care provider during your annual well check will  depend on your age, overall health, lifestyle risk factors, and family history of disease. Counseling  Your health care provider may ask you questions about your:  Alcohol use.  Tobacco use.  Drug use.  Emotional well-being.  Home and relationship well-being.  Sexual activity.  Eating habits.  History of falls.  Memory and ability to understand (cognition).  Work and work Statistician.  Reproductive health. Screening  You may have the following tests or measurements:  Height, weight, and BMI.  Blood pressure.  Lipid and cholesterol levels. These may be checked every 5 years, or more frequently if you are over 50 years old.  Skin check.  Lung cancer screening. You may have this screening every year starting at age 14 if you have a 30-pack-year history of smoking and currently smoke or have quit within the past 15 years.  Fecal occult blood test (FOBT) of the stool. You may have this test every year starting at age 53.  Flexible sigmoidoscopy or colonoscopy. You may have a sigmoidoscopy every 5 years or a colonoscopy every 10 years starting at age 2.  Hepatitis C blood test.  Hepatitis B blood test.  Sexually transmitted disease (STD) testing.  Diabetes screening. This is done by checking your blood sugar (glucose) after you have not eaten for a while (fasting). You may have this done every 1-3 years.  Bone density scan. This is done to screen for osteoporosis. You may have this done starting at age 81.  Mammogram. This may be done every 1-2 years. Talk to your health care provider about how often you should have regular mammograms. Talk with your health care  provider about your test results, treatment options, and if necessary, the need for more tests. Vaccines  Your health care provider may recommend certain vaccines, such as:  Influenza vaccine. This is recommended every year.  Tetanus, diphtheria, and acellular pertussis (Tdap, Td) vaccine. You may need a  Td booster every 10 years.  Zoster vaccine. You may need this after age 35.  Pneumococcal 13-valent conjugate (PCV13) vaccine. One dose is recommended after age 81.  Pneumococcal polysaccharide (PPSV23) vaccine. One dose is recommended after age 6. Talk to your health care provider about which screenings and vaccines you need and how often you need them. This information is not intended to replace advice given to you by your health care provider. Make sure you discuss any questions you have with your health care provider. Document Released: 03/07/2015 Document Revised: 10/29/2015 Document Reviewed: 12/10/2014 Elsevier Interactive Patient Education  2017 [place] Prevention in the Home Falls can cause injuries. They can happen to people of all ages. There are many things you can do to make your home safe and to help prevent falls. What can I do on the outside of my home?  Regularly fix the edges of walkways and driveways and fix any cracks.  Remove anything that might make you trip as you walk through a door, such as a raised step or threshold.  Trim any bushes or trees on the path to your home.  Use bright outdoor lighting.  Clear any walking paths of anything that might make someone trip, such as rocks or tools.  Regularly check to see if handrails are loose or broken. Make sure that both sides of any steps have handrails.  Any raised decks and porches should have guardrails on the edges.  Have any leaves, snow, or ice cleared regularly.  Use sand or salt on walking paths during winter.  Clean up any spills in your garage right away. This includes oil or grease spills. What can I do in the bathroom?  Use night lights.  Install grab bars by the toilet and in the tub and shower. Do not use towel bars as grab bars.  Use non-skid mats or decals in the tub or shower.  If you need to sit down in the shower, use a plastic, non-slip stool.  Keep the floor dry. Clean  up any water that spills on the floor as soon as it happens.  Remove soap buildup in the tub or shower regularly.  Attach bath mats securely with double-sided non-slip rug tape.  Do not have throw rugs and other things on the floor that can make you trip. What can I do in the bedroom?  Use night lights.  Make sure that you have a light by your bed that is easy to reach.  Do not use any sheets or blankets that are too big for your bed. They should not hang down onto the floor.  Have a firm chair that has side arms. You can use this for support while you get dressed.  Do not have throw rugs and other things on the floor that can make you trip. What can I do in the kitchen?  Clean up any spills right away.  Avoid walking on wet floors.  Keep items that you use a lot in easy-to-reach places.  If you need to reach something above you, use a strong step stool that has a grab bar.  Keep electrical cords out of the way.  Do not use floor polish  or wax that makes floors slippery. If you must use wax, use non-skid floor wax.  Do not have throw rugs and other things on the floor that can make you trip. What can I do with my stairs?  Do not leave any items on the stairs.  Make sure that there are handrails on both sides of the stairs and use them. Fix handrails that are broken or loose. Make sure that handrails are as long as the stairways.  Check any carpeting to make sure that it is firmly attached to the stairs. Fix any carpet that is loose or worn.  Avoid having throw rugs at the top or bottom of the stairs. If you do have throw rugs, attach them to the floor with carpet tape.  Make sure that you have a light switch at the top of the stairs and the bottom of the stairs. If you do not have them, ask someone to add them for you. What else can I do to help prevent falls?  Wear shoes that:  Do not have high heels.  Have rubber bottoms.  Are comfortable and fit you well.  Are  closed at the toe. Do not wear sandals.  If you use a stepladder:  Make sure that it is fully opened. Do not climb a closed stepladder.  Make sure that both sides of the stepladder are locked into place.  Ask someone to hold it for you, if possible.  Clearly mark and make sure that you can see:  Any grab bars or handrails.  First and last steps.  Where the edge of each step is.  Use tools that help you move around (mobility aids) if they are needed. These include:  Canes.  Walkers.  Scooters.  Crutches.  Turn on the lights when you go into a dark area. Replace any light bulbs as soon as they burn out.  Set up your furniture so you have a clear path. Avoid moving your furniture around.  If any of your floors are uneven, fix them.  If there are any pets around you, be aware of where they are.  Review your medicines with your doctor. Some medicines can make you feel dizzy. This can increase your chance of falling. Ask your doctor what other things that you can do to help prevent falls. This information is not intended to replace advice given to you by your health care provider. Make sure you discuss any questions you have with your health care provider. Document Released: 12/05/2008 Document Revised: 07/17/2015 Document Reviewed: 03/15/2014 Elsevier Interactive Patient Education  2017 [employer].

## 2018-08-15 NOTE — Progress Notes (Signed)
Patient: Katelyn Baker Female    DOB: 06-27-1945   73 y.o.   MRN: [id number] Visit Date: 08/15/2018  Today's Provider: Vernie Murders, PA   Chief Complaint  Patient presents with  . URI   Subjective:    I, Porsha McClurkin CMA, am acting as a Education administrator for [employer], [place].   Virtual Visit via Telephone Note  I connected with Katelyn Baker on 08/15/18 at  9:00 AM EDT by telephone and verified that I am speaking with the correct person using two identifiers.  Location: Patient: Home Provider: Office   I discussed the limitations, risks, security and privacy concerns of performing an evaluation and management service by telephone and the availability of in person appointments. I also discussed with the patient that there may be a patient responsible charge related to this service. The patient expressed understanding and agreed to proceed.  URI  The current episode started 1 to 4 weeks ago. The problem has been gradually improving. There has been no fever. Associated symptoms include coughing. She has tried sleep for the symptoms.  Patient states that she feels much better now and that her trip to the beach helped out a lot. No cough today. States she has trouble remembering due to dementia but feeling well today. Hear someone in the background prompting her answers.   Past Medical History:  Diagnosis Date  . GERD (gastroesophageal reflux disease)   . Hyperlipidemia   . Thyroid disease    Patient Active Problem List   Diagnosis Date Noted  . Mild protein-calorie malnutrition ([place]) 04/07/2018  . Essential hypertension 04/07/2018  . Benign essential tremor 09/30/2017  . Late onset Alzheimer's disease without behavioral disturbance ([place]) 04/29/2017  . Abnormal weight loss 04/29/2015  . Urge incontinence of urine 12/17/2014  . Hypercholesteremia 08/19/2014  . History of colon polyps 08/19/2014  . 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 Adult  hypothyroidism 10/02/2009  . Avitaminosis D 10/02/2009  . Abnormal blood sugar 06/24/2007  . Abnormal LFTs 06/23/2007  . Acid reflux 12/16/2005  . Bowel disease 12/16/2005  . Hypothyroidism, postop 12/16/2005   Past Surgical History:  Procedure Laterality Date  . ABDOMINAL HYSTERECTOMY     partial  . bladder tact    . BREAST BIOPSY Left 1980'S   EXCISIONAL - NEG  . BREAST SURGERY  2000's   biopsy  . THYROIDECTOMY, PARTIAL  1990's   Family History  Problem Relation Age of Onset  . Alzheimer's disease Mother   . Diabetes Mother        ?  Marland Kitchen Alcohol abuse Father   . Cancer Father        unknown cancer  . Diabetes Other   . Heart disease Other   . Cancer Other   . Colon polyps Other   . Breast cancer Neg Hx    Allergies  Allergen Reactions  . Bactrim [Sulfamethoxazole-Trimethoprim] Rash    Current Outpatient Medications:  .  buPROPion (WELLBUTRIN XL) 150 MG 24 hr tablet, Take 1 tablet (150 mg total) by mouth daily., Disp: 30 tablet, Rfl: 1 .  Calcium Carbonate (CALCIUM 600 PO), Take 600 mg by mouth daily. , Disp: , Rfl:  .  donepezil (ARICEPT) 5 MG tablet, TAKE 1 TABLET BY MOUTH AT BEDTIME, Disp: 90 tablet, Rfl: 1 .  levothyroxine (SYNTHROID, LEVOTHROID) 100 MCG tablet, TAKE 1 TABLET BY MOUTH ONCE DAILY, Disp: 90 tablet, Rfl: 1 .  lisinopril (PRINIVIL,ZESTRIL) 10 MG tablet, Take  1 tablet (10 mg total) by mouth daily., Disp: 90 tablet, Rfl: 3 .  Multiple Vitamin (MULTIVITAMIN) tablet, Take 1 tablet by mouth daily., Disp: , Rfl:  .  Omega-3 Fatty Acids (FISH OIL PO), Take by mouth., Disp: , Rfl:  .  solifenacin (VESICARE) 10 MG tablet, TAKE 1 TABLET BY MOUTH ONCE DAILY, Disp: 90 tablet, Rfl: 1 .  VITAMIN D, ERGOCALCIFEROL, PO, Take 2,000 Int'l Units by mouth daily. , Disp: , Rfl:   Review of Systems  Constitutional: Negative for fever.  HENT: Negative.   Respiratory: Positive for cough.   Genitourinary: Negative.   Neurological: Negative.     Social History    Tobacco Use  . Smoking status: Passive Smoke Exposure - Never Smoker  . Smokeless tobacco: Never Used  Substance Use Topics  . Alcohol use: No     Objective:   There were no vitals taken for this visit.  WDWN female in no apparent distress Respiratory: No apparent distress Psych: Normal mood and affect. Polite but a little confused. Asking family about reason for the call and prompting to answer questions.     Assessment & Plan     1. Cough Has had a cough recently but feels it is much better since she took a 4 day trip to the beach last week. Denies fever but cannot remember much about cough and denies having any today. Advised to call back if any symptoms returning or fever developing. May use cough drop or Mucinex-DM prn.  2. Late onset Alzheimer's disease without behavioral disturbance (HCC) Presently on Aricept 5 mg qd and recognizes memory is difficult now. Cooperative and pleasant demeanor today. Encouraged to proceed with Nurse Health Advisor AWE Screening for CPE in July 2020.  I discussed the assessment and treatment plan with the patient. The patient was provided an opportunity to ask questions and all were answered. The patient agreed with the plan and demonstrated an understanding of the instructions.   The patient was advised to call back or seek an in-person evaluation if the symptoms worsen or if the condition fails to improve as anticipated.  I provided 10 minutes of non-face-to-face time during this encounter.    Dortha Kernennis Kaysia Willard, PA  [facility]

## 2018-09-06 ENCOUNTER — Ambulatory Visit: Payer: Self-pay | Admitting: Physician Assistant

## 2018-09-06 ENCOUNTER — Telehealth: Payer: Self-pay | Admitting: Physician Assistant

## 2018-09-06 NOTE — Telephone Encounter (Signed)
Please advise 

## 2018-09-06 NOTE — Telephone Encounter (Signed)
Pt' s daughter called concerned about moms dementia getting worse.  She would like to talk  to [place] about some things that have been going on  CB#  [phone]  [facility]

## 2018-09-12 NOTE — Telephone Encounter (Signed)
Daughter Katelyn Baker called regarding mother Katelyn Baker.  She hasn't heard back from office since last Wed.  Her mother is really combative, not telling family if she is taking her medications.  The family is at a loss trying to help her.  She has packed her bags to leave at least 4 times.   Daughter Katelyn Baker is trying to figure out the next steps to help pt.  She states her mother will not come in for a visit. Please call Katelyn Baker to discuss what may be next steps.  [phone] Katelyn Baker  [email or web address] - can email as well.  Thanks, [employer]

## 2018-09-12 NOTE — Telephone Encounter (Signed)
Spoke with daughter. She is going to call and set up appt when she can bring her mother and talk about medications and considering [place] CSW and nursing to evaluate.

## 2018-09-26 ENCOUNTER — Other Ambulatory Visit: Payer: Self-pay | Admitting: Physician Assistant

## 2018-09-26 DIAGNOSIS — F0390 Unspecified dementia without behavioral disturbance: Secondary | ICD-10-CM

## 2018-12-14 ENCOUNTER — Other Ambulatory Visit: Payer: Self-pay

## 2018-12-14 ENCOUNTER — Encounter: Payer: Medicare Other | Admitting: Physician Assistant

## 2018-12-14 ENCOUNTER — Telehealth: Payer: Self-pay

## 2018-12-14 NOTE — Progress Notes (Signed)
Opened in error. Patient never answered phone for telephone interview

## 2018-12-14 NOTE — Telephone Encounter (Signed)
Husband calling c/o chest tightness/hurting for the 4 months.  Some cough, SOB,no fever.  Patient has been taking Mucinex.

## 2018-12-14 NOTE — Telephone Encounter (Signed)
Ok to make telephone encounter at [employer]

## 2018-12-23 ENCOUNTER — Other Ambulatory Visit: Payer: Self-pay | Admitting: Physician Assistant

## 2018-12-23 DIAGNOSIS — E890 Postprocedural hypothyroidism: Secondary | ICD-10-CM

## 2019-04-01 ENCOUNTER — Ambulatory Visit: Payer: Medicare PPO | Attending: Internal Medicine

## 2019-04-01 DIAGNOSIS — Z23 Encounter for immunization: Secondary | ICD-10-CM

## 2019-04-01 NOTE — Progress Notes (Signed)
   Covid-19 Vaccination Clinic  Name:  Katelyn Baker    MRN: [id number] DOB: 1945-06-19  04/01/2019  Katelyn Baker was observed post Covid-19 immunization for 15 minutes without incidence. She was provided with Vaccine Information Sheet and instruction to access the V-Safe system.   Katelyn Baker was instructed to call 911 with any severe reactions post vaccine: Marland Kitchen Difficulty breathing  . Swelling of your face and throat  . A fast heartbeat  . A bad rash all over your body  . Dizziness and weakness    Immunizations Administered    Name Date Dose VIS Date Route   Pfizer COVID-19 Vaccine 04/01/2019  8:47 AM 0.3 mL 02/02/2019 Intramuscular   Manufacturer: [employer], [employer]   Lot: [id number]   NDC: [address]-4

## 2019-04-02 ENCOUNTER — Ambulatory Visit: Payer: Medicare PPO

## 2019-04-02 NOTE — Progress Notes (Signed)
Patient: Katelyn Baker Female    DOB: [birth year]   74 y.o.   MRN: [id number] Visit Date: 04/05/2019  Today's Provider: [name], PA-C   Chief Complaint  Patient presents with  . Follow-up   Subjective:     HPI  Patient here to follow up on chronic issues. Patient accompanied by her daughter,[name]. Daughter has concerns her mom has not been taking her medications regularly. She does have dementia and one of her granddaughter's had been coming over to fill a pill box but they are not sure if the patient has been taking any of them. They worry this may have been going on for a while.  Patient also complains of having chest pain. She reports this feels like a pressure in her chest, pointing to mid sternal region. She denies any SOB, cough, wheezing. She reports it has been going on for "a few days" but her daughter reports she has been complaining about this for a few months. She denies any worsening of this when she is active. They walked up the stairs to our office today and did not complain or have issues doing that.   She does report having increased fatigue as well. Daughter is questioning if the fatigue is coming from her not taking her medications appropriately. Some of her medications would cause fatigue if not taken regularly.   Allergies  Allergen Reactions  . Bactrim [Sulfamethoxazole-Trimethoprim] Rash     Current Outpatient Medications:  .  buPROPion (WELLBUTRIN XL) 150 MG 24 hr tablet, Take 1 tablet (150 mg total) by mouth daily., Disp: 30 tablet, Rfl: 1 .  Calcium Carbonate (CALCIUM 600 PO), Take 600 mg by mouth daily. , Disp: , Rfl:  .  donepezil (ARICEPT) 5 MG tablet, TAKE 1 TABLET BY MOUTH AT BEDTIME, Disp: 90 tablet, Rfl: 0 .  levothyroxine (SYNTHROID) 100 MCG tablet, Take 1 tablet by mouth once daily, Disp: 90 tablet, Rfl: 0 .  lisinopril (PRINIVIL,ZESTRIL) 10 MG tablet, Take 1 tablet (10 mg total) by mouth daily., Disp: 90 tablet, Rfl: 3 .   Multiple Vitamin (MULTIVITAMIN) tablet, Take 1 tablet by mouth daily., Disp: , Rfl:  .  solifenacin (VESICARE) 10 MG tablet, TAKE 1 TABLET BY MOUTH ONCE DAILY, Disp: 90 tablet, Rfl: 1 .  VITAMIN D, ERGOCALCIFEROL, PO, Take 2,000 Int'l Units by mouth daily. , Disp: , Rfl:   Review of Systems  Constitutional: Positive for fatigue.  HENT: Negative.   Respiratory: Negative for cough, chest tightness and shortness of breath.   Cardiovascular: Positive for chest pain. Negative for palpitations and leg swelling.  Gastrointestinal: Negative for abdominal pain, constipation, diarrhea and nausea.  Neurological: Positive for dizziness, weakness and light-headedness. Negative for numbness.  Psychiatric/Behavioral: Positive for dysphoric mood.    Social History   Tobacco Use  . Smoking status: Passive Smoke Exposure - Never Smoker  . Smokeless tobacco: Never Used  Substance Use Topics  . Alcohol use: No      Objective:   BP (!) 165/91 (BP Location: Left Arm, Patient Position: Sitting, Cuff Size: Normal)   Pulse 62   Temp (!) 97.4 F (36.3 C) (Temporal)   Resp 16   Wt 147 lb 3.2 oz (66.8 kg)   BMI 26.08 kg/m  Vitals:   04/05/19 1400  BP: (!) 165/91  Pulse: 62  Resp: 16  Temp: (!) 97.4 F (36.3 C)  TempSrc: Temporal  Weight: 147 lb 3.2 oz (66.8 kg)  Body mass  index is 26.08 kg/m.   Physical Exam Vitals reviewed.  Constitutional:      General: She is not in acute distress.    Appearance: Normal appearance. She is well-developed. She is not diaphoretic.     Comments: Appearance was more disheveled than any previous times  Neck:     Thyroid: No thyromegaly.     Vascular: No JVD.     Trachea: No tracheal deviation.  Cardiovascular:     Rate and Rhythm: Normal rate and regular rhythm.     Heart sounds: Normal heart sounds. No murmur. No friction rub. No gallop.   Pulmonary:     Effort: Pulmonary effort is normal. No respiratory distress.     Breath sounds: Normal breath  sounds. No wheezing or rales.  Musculoskeletal:     Cervical back: Normal range of motion and neck supple.  Lymphadenopathy:     Cervical: No cervical adenopathy.  Neurological:     Mental Status: She is alert.      No results found for any visits on 04/05/19.     Assessment & Plan    1. Other chest pain EKG shows NSR with LAFB and diffuse non specific t wave abnormality. No previous EKG to compare. Referral placed to Dr. Saralyn Pilar for further evaluation to r/o cardiac source. Patient is poor historian due to dementia and will require a family member present. Will check labs as below and f/u pending results. - EKG 12-Lead - CBC w/Diff/Platelet - Comprehensive Metabolic Panel (CMET) - Ambulatory referral to Cardiology  2. Hypothyroidism, postop Suppose to be taking Levothyroxine 174mcg but unsure if taking regularly. Will check labs as below and f/u pending results. - TSH  3. Essential hypertension BP is elevated but it is felt patient has not been taking medications. Will not make changes to Lisinopril 10mg . Will try to take more regularly and will f/u in 2-4 weeks. Will check labs as below and f/u pending results. - CBC w/Diff/Platelet - Comprehensive Metabolic Panel (CMET) - Lipid Panel With LDL/HDL Ratio - HgB A1c  4. Late onset Alzheimer's disease without behavioral disturbance ([place]) Unsure if patient is taking Donepezil.   5. Hypercholesteremia Diet controlled. Will check labs as below and f/u pending results. - CBC w/Diff/Platelet - Comprehensive Metabolic Panel (CMET) - Lipid Panel With LDL/HDL Ratio - HgB A1c  6. Avitaminosis D Not sure if taking supplement. Will check labs as below and f/u pending results. - CBC w/Diff/Platelet - Vitamin D (25 hydroxy)     Mar Daring, PA-C  [facility]

## 2019-04-05 ENCOUNTER — Ambulatory Visit: Payer: Medicare PPO | Admitting: Physician Assistant

## 2019-04-05 ENCOUNTER — Other Ambulatory Visit: Payer: Self-pay

## 2019-04-05 ENCOUNTER — Encounter: Payer: Self-pay | Admitting: Physician Assistant

## 2019-04-05 VITALS — BP 165/91 | HR 62 | Temp 97.4°F | Resp 16 | Wt 147.2 lb

## 2019-04-05 DIAGNOSIS — E7800 Pure hypercholesterolemia, unspecified: Secondary | ICD-10-CM

## 2019-04-05 DIAGNOSIS — I10 Essential (primary) hypertension: Secondary | ICD-10-CM | POA: Diagnosis not present

## 2019-04-05 DIAGNOSIS — E890 Postprocedural hypothyroidism: Secondary | ICD-10-CM

## 2019-04-05 DIAGNOSIS — R0789 Other chest pain: Secondary | ICD-10-CM | POA: Diagnosis not present

## 2019-04-05 DIAGNOSIS — F0280 Dementia in other diseases classified elsewhere without behavioral disturbance: Secondary | ICD-10-CM

## 2019-04-05 DIAGNOSIS — G301 Alzheimer's disease with late onset: Secondary | ICD-10-CM | POA: Diagnosis not present

## 2019-04-05 DIAGNOSIS — E559 Vitamin D deficiency, unspecified: Secondary | ICD-10-CM

## 2019-04-05 NOTE — Patient Instructions (Signed)
Angina  Angina is very bad discomfort or pain in the chest, neck, arm, jaw, or back. The discomfort is caused by a lack of blood in the middle layer of the heart wall (myocardium). What are the causes? This condition is caused by a buildup of fat and cholesterol (plaque) in your arteries (atherosclerosis). This buildup narrows the arteries and makes it hard for blood to flow. What increases the risk? You are more likely to develop this condition if:  You have high levels of cholesterol in your blood.  You have high blood pressure (hypertension).  You have diabetes.  You have a family history of heart disease.  You are not active, or you do not exercise enough.  You feel sad (depressed).  You have been treated with high energy rays (radiation) on the left side of your chest. Other risk factors are:  Using tobacco.  Being very overweight (obese).  Eating a diet high in unhealthy fats (saturated fats).  Having stress, or being exposed to things that cause stress.  Using drugs, such as cocaine. Women have a greater risk for angina if:  They are older than 55.  They have stopped having their period (are in postmenopause). What are the signs or symptoms? Common symptoms of this condition in both men and women may include:  Chest pain, which may: ? Feel like a crushing or squeezing in the chest. ? Feel like a tightness, pressure, fullness, or heaviness in the chest. ? Last for more than a few minutes at a time. ? Stop and come back (recur) after a few minutes.  Pain in the neck, arm, jaw, or back.  Heartburn or upset stomach (indigestion) for no reason.  Being short of breath.  Feeling sick to your stomach (nauseous).  Sudden cold sweats. Women and people with diabetes may have other symptoms that are not usual, such as feeling:  Tired (fatigue).  Worried or nervous (anxious) for no reason.  Weak for no reason.  Dizzy or passing out (fainting). How is this  treated? This condition may be treated with:  Medicines. These are given to: ? Prevent blood clots. ? Prevent heart attack. ? Relax blood vessels and improve blood flow to the heart (nitrates). ? Reduce blood pressure. ? Improve the pumping action of the heart. ? Reduce fat and cholesterol in the blood.  A procedure to widen a narrowed or blocked artery in the heart (angioplasty).  Surgery to allow blood to go around a blocked artery (coronary artery bypass surgery). Follow these instructions at home: Medicines  Take over-the-counter and prescription medicines only as told by your doctor.  Do not take these medicines unless your doctor says that you can: ? NSAIDs. These include:  Ibuprofen.  Naproxen. ? Vitamin supplements that have vitamin A, vitamin E, or both. ? Hormone therapy that contains estrogen with or without progestin. Eating and drinking   Eat a heart-healthy diet that includes: ? Lots of fresh fruits and vegetables. ? Whole grains. ? Low-fat (lean) protein. ? Low-fat dairy products.  Follow instructions from your doctor about what you cannot eat or drink. Activity  Follow an exercise program that your doctor tells you.  Talk with your doctor about joining a program to help improve the health of your heart (cardiac rehab).  When you feel tired, take a break. Plan breaks if you know you are going to feel tired. Lifestyle   Do not use any products that contain nicotine or tobacco. This includes cigarettes, e-cigarettes, and   chewing tobacco. If you need help quitting, ask your doctor.  If your doctor says you can drink alcohol: ? Limit how much you use to:  0-1 drink a day for women who are not pregnant.  0-2 drinks a day for men. ? Be aware of how much alcohol is in your drink. In the [place] one drink equals:  One 12 oz bottle of beer (355 mL).  One 5 oz glass of wine (148 mL).  One 1 oz glass of hard liquor (44 mL). General instructions  Stay  at a healthy weight. If your doctor tells you to do so, work with him or her to lose weight.  Learn to deal with stress. If you need help, ask your doctor.  Keep your vaccines up to date. Get a flu shot every year.  Talk with your doctor if you feel sad. Take a screening test to see if you are at risk for depression.  Work with your doctor to manage any other health problems that you have. These may include diabetes or high blood pressure.  Keep all follow-up visits as told by your doctor. This is important. Get help right away if:  You have pain in your chest, neck, arm, jaw, or back, and the pain: ? Lasts more than a few minutes. ? Comes back. ? Does not get better after you take medicine under your tongue (sublingual nitroglycerin). ? Keeps getting worse. ? Comes more often.  You have any of these problems for no reason: ? Sweating a lot. ? Heartburn or upset stomach. ? Shortness of breath. ? Trouble breathing. ? Feeling sick to your stomach. ? Throwing up (vomiting). ? Feeling more tired than normal. ? Feeling nervous or worrying more than normal. ? Weakness.  You are suddenly dizzy or light-headed.  You pass out. These symptoms may be an emergency. Do not wait to see if the symptoms will go away. Get medical help right away. Call your local emergency services (911 in the [place] Do not drive yourself to the hospital. Summary  Angina is very bad discomfort or pain in the chest, neck, arm, neck, or back.  Symptoms include chest pain, heartburn or upset stomach for no reason, and shortness of breath.  Women or people with diabetes may have symptoms that are not usual, such as feeling nervous or worried for no reason, weak for no reason, or tired.  Take all medicines only as told by your doctor.  You should eat a heart-healthy diet and follow an exercise program. This information is not intended to replace advice given to you by your health care provider. Make sure you  discuss any questions you have with your health care provider. Document Revised: 09/26/2017 Document Reviewed: 09/26/2017 Elsevier Patient Education  2020 Elsevier Inc.  

## 2019-04-10 ENCOUNTER — Encounter: Payer: Self-pay | Admitting: Physician Assistant

## 2019-04-10 ENCOUNTER — Telehealth: Payer: Self-pay

## 2019-04-10 DIAGNOSIS — E890 Postprocedural hypothyroidism: Secondary | ICD-10-CM

## 2019-04-10 DIAGNOSIS — F0390 Unspecified dementia without behavioral disturbance: Secondary | ICD-10-CM

## 2019-04-10 DIAGNOSIS — G301 Alzheimer's disease with late onset: Secondary | ICD-10-CM

## 2019-04-10 DIAGNOSIS — F0280 Dementia in other diseases classified elsewhere without behavioral disturbance: Secondary | ICD-10-CM

## 2019-04-10 DIAGNOSIS — I10 Essential (primary) hypertension: Secondary | ICD-10-CM

## 2019-04-10 DIAGNOSIS — F320 Major depressive disorder, single episode, mild: Secondary | ICD-10-CM

## 2019-04-10 LAB — CBC WITH DIFFERENTIAL/PLATELET
Basophils Absolute: 0 10*3/uL (ref 0.0–0.2)
Basos: 1 %
EOS (ABSOLUTE): 0.1 10*3/uL (ref 0.0–0.4)
Eos: 2 %
Hematocrit: 38.2 % (ref 34.0–46.6)
Hemoglobin: 13.6 g/dL (ref 11.1–15.9)
Immature Grans (Abs): 0.1 10*3/uL (ref 0.0–0.1)
Immature Granulocytes: 1 %
Lymphocytes Absolute: 0.7 10*3/uL (ref 0.7–3.1)
Lymphs: 13 %
MCH: 33.1 pg — ABNORMAL HIGH (ref 26.6–33.0)
MCHC: 35.6 g/dL (ref 31.5–35.7)
MCV: 93 fL (ref 79–97)
Monocytes Absolute: 0.4 10*3/uL (ref 0.1–0.9)
Monocytes: 8 %
Neutrophils Absolute: 4.2 10*3/uL (ref 1.4–7.0)
Neutrophils: 75 %
Platelets: 151 10*3/uL (ref 150–450)
RBC: 4.11 x10E6/uL (ref 3.77–5.28)
RDW: 14.2 % (ref 11.7–15.4)
WBC: 5.6 10*3/uL (ref 3.4–10.8)

## 2019-04-10 LAB — LIPID PANEL WITH LDL/HDL RATIO
Cholesterol, Total: 296 mg/dL — ABNORMAL HIGH (ref 100–199)
HDL: 81 mg/dL (ref >39)
LDL Chol Calc (NIH): 185 mg/dL — ABNORMAL HIGH (ref 0–99)
LDL/HDL Ratio: 2.3 ratio (ref 0.0–3.2)
Triglycerides: 165 mg/dL — ABNORMAL HIGH (ref 0–149)
VLDL Cholesterol Cal: 30 mg/dL (ref 5–40)

## 2019-04-10 LAB — VITAMIN D 25 HYDROXY (VIT D DEFICIENCY, FRACTURES): Vit D, 25-Hydroxy: 14 ng/mL — ABNORMAL LOW (ref 30.0–100.0)

## 2019-04-10 LAB — COMPREHENSIVE METABOLIC PANEL
ALT: 33 IU/L — ABNORMAL HIGH (ref 0–32)
AST: 36 IU/L (ref 0–40)
Albumin/Globulin Ratio: 2.1 (ref 1.2–2.2)
Albumin: 4.5 g/dL (ref 3.7–4.7)
Alkaline Phosphatase: 73 IU/L (ref 39–117)
BUN/Creatinine Ratio: 11 — ABNORMAL LOW (ref 12–28)
BUN: 12 mg/dL (ref 8–27)
Bilirubin Total: 0.4 mg/dL (ref 0.0–1.2)
CO2: 26 mmol/L (ref 20–29)
Calcium: 8.9 mg/dL (ref 8.7–10.3)
Chloride: 100 mmol/L (ref 96–106)
Creatinine, Ser: 1.14 mg/dL — ABNORMAL HIGH (ref 0.57–1.00)
GFR calc Af Amer: 56 mL/min/{1.73_m2} — ABNORMAL LOW (ref >59)
GFR calc non Af Amer: 48 mL/min/{1.73_m2} — ABNORMAL LOW (ref >59)
Globulin, Total: 2.1 g/dL (ref 1.5–4.5)
Glucose: 94 mg/dL (ref 65–99)
Potassium: 3.4 mmol/L — ABNORMAL LOW (ref 3.5–5.2)
Sodium: 139 mmol/L (ref 134–144)
Total Protein: 6.6 g/dL (ref 6.0–8.5)

## 2019-04-10 LAB — HEMOGLOBIN A1C
Est. average glucose Bld gHb Est-mCnc: 126 mg/dL
Hgb A1c MFr Bld: 6 % — ABNORMAL HIGH (ref 4.8–5.6)

## 2019-04-10 LAB — TSH: TSH: 121 u[IU]/mL — ABNORMAL HIGH (ref 0.450–4.500)

## 2019-04-10 NOTE — Telephone Encounter (Signed)
Patient's daughter Katelyn Baker advised as directed below.

## 2019-04-10 NOTE — Telephone Encounter (Signed)
-----   Message from [name], [place] sent at 04/10/2019 10:41 AM EST ----- Sorry for the delay in lab results, I have been waiting for her TSH to result. As expected it appears she has not been taking her medications. Her blood count is normal. Her kidney function has decreased some. This shows she is most likely dehydrated. Her potassium is also borderline low. I don't think she needs a potassium supplement, but could benefit by adding sweet potatoes, bananas, leafy greens, etc in her diet. Her thyroid is very underactive, showing she has not been taking this medication. Would recommend for her to restart her medication. Cholesterol is also quite elevated compared to previous. I don't think she has been on cholesterol medication in the past, but may be worthwhile for [place] to consider, but I want to wait until we get her restarted on all her other medications at this time. Her A1c is also up. With the increase in cholesterol and sugar levels I can tell her diet has drastically changed compared to her previous diet. If we can work on getting her to eat less fatty foods, processed meats/foods, red meats and sugars, hopefully these labs would improve some or stabilize. Also as expected her Vit D is low, showing she is also not taking her Vit D supplement. Please call daughter listed for results.

## 2019-04-12 NOTE — Addendum Note (Signed)
Addended by: [name] on: 04/12/2019 01:34 PM   Modules accepted: Orders

## 2019-04-16 ENCOUNTER — Ambulatory Visit: Payer: Medicare PPO

## 2019-04-16 MED ORDER — DONEPEZIL HCL 5 MG PO TABS: 5.0000 mg | ORAL_TABLET | Freq: Every day | ORAL | 3 refills | Status: DC

## 2019-04-16 MED ORDER — BUPROPION HCL ER (XL) 150 MG PO TB24: 150.0000 mg | ORAL_TABLET | Freq: Every day | ORAL | 3 refills | Status: DC

## 2019-04-16 MED ORDER — LEVOTHYROXINE SODIUM 100 MCG PO TABS: 100.0000 ug | ORAL_TABLET | Freq: Every day | ORAL | 3 refills | Status: DC

## 2019-04-16 MED ORDER — LISINOPRIL 10 MG PO TABS: 10.0000 mg | ORAL_TABLET | Freq: Every day | ORAL | 3 refills | Status: DC

## 2019-04-16 NOTE — Addendum Note (Signed)
Addended by: [name] on: 04/16/2019 09:19 AM   Modules accepted: Orders

## 2019-04-24 ENCOUNTER — Ambulatory Visit: Payer: Medicare PPO

## 2019-04-24 ENCOUNTER — Ambulatory Visit: Payer: Medicare PPO | Attending: Internal Medicine

## 2019-04-24 DIAGNOSIS — Z23 Encounter for immunization: Secondary | ICD-10-CM | POA: Insufficient documentation

## 2019-04-24 NOTE — Progress Notes (Signed)
   Covid-19 Vaccination Clinic  Name:  Katelyn Baker    MRN: [id number] DOB: [birth year]  04/24/2019  Katelyn Baker was observed post Covid-19 immunization for 15 minutes without incident. She was provided with Vaccine Information Sheet and instruction to access the V-Safe system.   Katelyn Baker was instructed to call 911 with any severe reactions post vaccine: Marland Kitchen Difficulty breathing  . Swelling of face and throat  . A fast heartbeat  . A bad rash all over body  . Dizziness and weakness   Immunizations Administered    Name Date Dose VIS Date Route   Pfizer COVID-19 Vaccine 04/24/2019 10:56 AM 0.3 mL 02/02/2019 Intramuscular   Manufacturer: [employer], [employer]   Lot: [id number]   NDC: [address]-9

## 2019-04-26 ENCOUNTER — Telehealth: Payer: Self-pay

## 2019-04-26 DIAGNOSIS — I10 Essential (primary) hypertension: Secondary | ICD-10-CM

## 2019-04-26 MED ORDER — 3 SERIES BP MONITOR/WRIST DEVI: 0 refills | Status: DC

## 2019-04-26 NOTE — Telephone Encounter (Signed)
Patient's daughter French Ana advised

## 2019-04-26 NOTE — Telephone Encounter (Signed)
Order for cuff sent to [facility] [place]

## 2019-04-26 NOTE — Telephone Encounter (Signed)
Copied from CRM [id number]. Topic: General - Other >> Apr 26, 2019 10:54 AM Jaquita Rector A wrote: Reason for CRM: [place] with [facility] called to report that patient fell on 04/25/19 tripped over an object again this morning she tripped but there are no abnormal signs of injury. Raoul Pitch is concerned of patient being  on lisinopril (ZESTRIL) 10 MG tablet which may have caused dizziness patient reported past dizziness but nothing now. Asking for orders to get a BP monitor so she can monitor BP at least once a week when visited by the nurse. Ph# [phone]

## 2019-05-03 ENCOUNTER — Telehealth: Payer: Self-pay | Admitting: Physician Assistant

## 2019-05-03 DIAGNOSIS — F02818 Dementia in other diseases classified elsewhere, unspecified severity, with other behavioral disturbance: Secondary | ICD-10-CM

## 2019-05-03 DIAGNOSIS — F0281 Dementia in other diseases classified elsewhere with behavioral disturbance: Secondary | ICD-10-CM

## 2019-05-03 NOTE — Telephone Encounter (Signed)
Signed accidentally

## 2019-05-03 NOTE — Telephone Encounter (Signed)
Referral for social work placed  

## 2019-05-03 NOTE — Telephone Encounter (Signed)
Sierra from [facility], called requesting a consult for a Child psychotherapist. Pt is about to move into an assisted living facility. Best contact: [phone]

## 2019-05-03 NOTE — Addendum Note (Signed)
Addended by: [name] on: 05/03/2019 12:39 PM   Modules accepted: Orders

## 2019-05-09 ENCOUNTER — Encounter: Payer: Self-pay | Admitting: Physician Assistant

## 2019-05-10 ENCOUNTER — Telehealth: Payer: Self-pay

## 2019-05-10 NOTE — Telephone Encounter (Signed)
Noted. Warm compresses and wetting eye drops as needed can help if having any discomfort.

## 2019-05-10 NOTE — Telephone Encounter (Signed)
Pt.'s daughter, French Ana, called back. Given instructions. She states that they eye "isn't bothering Mom at all." Will monitor.

## 2019-05-10 NOTE — Telephone Encounter (Signed)
LMTCB, [facility] Triage Nurse may give patient message  

## 2019-05-10 NOTE — Telephone Encounter (Signed)
Please Review

## 2019-05-10 NOTE — Telephone Encounter (Signed)
Copied from CRM [id number]. 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 Cb# [phone]

## 2019-05-23 ENCOUNTER — Encounter: Payer: Self-pay | Admitting: Physician Assistant

## 2019-05-23 NOTE — Telephone Encounter (Signed)
Katelyn Baker is back in the office tomorrow morning.  An FL 2 will need to be completed for the patient

## 2019-05-31 ENCOUNTER — Ambulatory Visit: Payer: Medicare PPO | Admitting: Physician Assistant

## 2019-05-31 ENCOUNTER — Encounter: Payer: Self-pay | Admitting: Physician Assistant

## 2019-05-31 ENCOUNTER — Other Ambulatory Visit: Payer: Self-pay

## 2019-05-31 VITALS — BP 153/77 | HR 66 | Temp 97.3°F | Wt 134.0 lb

## 2019-05-31 DIAGNOSIS — G301 Alzheimer's disease with late onset: Secondary | ICD-10-CM | POA: Diagnosis not present

## 2019-05-31 DIAGNOSIS — R3989 Other symptoms and signs involving the genitourinary system: Secondary | ICD-10-CM | POA: Diagnosis not present

## 2019-05-31 DIAGNOSIS — F0280 Dementia in other diseases classified elsewhere without behavioral disturbance: Secondary | ICD-10-CM | POA: Diagnosis not present

## 2019-05-31 DIAGNOSIS — R410 Disorientation, unspecified: Secondary | ICD-10-CM | POA: Diagnosis not present

## 2019-05-31 LAB — POCT URINALYSIS DIPSTICK
Blood, UA: NEGATIVE
Glucose, UA: NEGATIVE
Ketones, UA: NEGATIVE
Nitrite, UA: NEGATIVE
Protein, UA: NEGATIVE
Spec Grav, UA: >=1.03 — AB (ref 1.010–1.025)
Urobilinogen, UA: 0.2 E.U./dL
pH, UA: 6 (ref 5.0–8.0)

## 2019-05-31 MED ORDER — CEPHALEXIN 500 MG PO CAPS: 500.0000 mg | ORAL_CAPSULE | Freq: Two times a day (BID) | ORAL | 0 refills | Status: DC

## 2019-05-31 MED ORDER — QUETIAPINE FUMARATE 25 MG PO TABS: 25.0000 mg | ORAL_TABLET | Freq: Every day | ORAL | 1 refills | Status: DC

## 2019-05-31 NOTE — Patient Instructions (Signed)
Quetiapine tablets What is this medicine? QUETIAPINE (kwe TYE a peen) is an antipsychotic. It is used to treat schizophrenia and bipolar disorder, also known as manic-depression. This medicine may be used for other purposes; ask your health care provider or pharmacist if you have questions. COMMON BRAND NAME(S): Seroquel What should I tell my health care provider before I take this medicine? They need to know if you have any of these conditions:  blockage in your bowel  cataracts  constipation  dehydration  diabetes  difficulty swallowing  glaucoma  heart disease  history of breast cancer  kidney disease  liver disease  low blood counts, like low white cell, platelet, or red cell counts  low blood pressure or dizziness when standing up  Parkinson's disease  previous heart attack  prostate disease  seizures  stomach or intestine problems  suicidal thoughts, plans or attempt; a previous suicide attempt by you or a family member  thyroid disease  trouble passing urine  an unusual or allergic reaction to quetiapine, other medicines, foods, dyes, or preservatives  pregnant or trying to get pregnant  breast-feeding How should I use this medicine? Take this medicine by mouth. Swallow it with a drink of water. Follow the directions on the prescription label. If it upsets your stomach you can take it with food. Take your medicine at regular intervals. Do not take it more often than directed. Do not stop taking except on the advice of your doctor or health care professional. A special MedGuide will be given to you by the pharmacist with each prescription and refill. Be sure to read this information carefully each time. Talk to your pediatrician regarding the use of this medicine in children. While this drug may be prescribed for children as young as 10 years for selected conditions, precautions do apply. Patients over age 65 years may have a stronger reaction to this  medicine and need smaller doses. Overdosage: If you think you have taken too much of this medicine contact a poison control center or emergency room at once. NOTE: This medicine is only for you. Do not share this medicine with others. What if I miss a dose? If you miss a dose, take it as soon as you can. If it is almost time for your next dose, take only that dose. Do not take double or extra doses. What may interact with this medicine? Do not take this medicine with any of the following medications:  cisapride  dronedarone  fluconazole  metoclopramide  pimozide  posaconazole  thioridazine This medicine may also interact with the following medications:  alcohol  antihistamines for allergy cough and cold  antiviral medicines for HIV or AIDS  atropine  certain medicines for bladder problems like oxybutynin, tolterodine  certain medicines for blood pressure  certain medicines for depression, anxiety, or psychotic disturbances  certain medicines for diabetes  certain medicines for stomach problems like dicyclomine, hyoscyamine  certain medicines for travel sickness like scopolamine  certain medicines for Parkinson's disease  certain medicines for seizures like carbamazepine, phenobarbital, phenytoin  cimetidine  erythromycin  ipratropium  other medicines that prolong the QT interval (cause an abnormal heart rhythm) like dofetilide  rifampin  steroid medicines like prednisone or cortisone This list may not describe all possible interactions. Give your health care provider a list of all the medicines, herbs, non-prescription drugs, or dietary supplements you use. Also tell them if you smoke, drink alcohol, or use illegal drugs. Some items may interact with your medicine. What   should I watch for while using this medicine? Visit your health care professional for regular checks on your progress. Tell your health care professional if symptoms do not start to get  better or if they get worse. Do not stop taking except on your health care professional's advice. You may develop a severe reaction. Your health care professional will tell you how much medicine to take. You may need to have an eye exam before and during use of this medicine. This medicine may increase blood sugar. Ask your health care provider if changes in diet or medicines are needed if you have diabetes. Patients and their families should watch out for new or worsening depression or thoughts of suicide. Also watch out for sudden or severe changes in feelings such as feeling anxious, agitated, panicky, irritable, hostile, aggressive, impulsive, severely restless, overly excited and hyperactive, or not being able to sleep. If this happens, especially at the beginning of antidepressant treatment or after a change in dose, call your health care professional. You may get dizzy or drowsy. Do not drive, use machinery, or do anything that needs mental alertness until you know how this medicine affects you. Do not stand or sit up quickly, especially if you are an older patient. This reduces the risk of dizzy or fainting spells. Alcohol may interfere with the effect of this medicine. Avoid alcoholic drinks. This drug can cause problems with controlling your body temperature. It can lower the response of your body to cold temperatures. If possible, stay indoors during cold weather. If you must go outdoors, wear warm clothes. It can also lower the response of your body to heat. Do not overheat. Do not over-exercise. Stay out of the sun when possible. If you must be in the sun, wear cool clothing. Drink plenty of water. If you have trouble controlling your body temperature, call your health care provider right away. What side effects may I notice from receiving this medicine? Side effects that you should report to your doctor or health care professional as soon as possible:  allergic reactions like skin rash,  itching or hives, swelling of the face, lips, or tongue  breathing problems  changes in vision  confusion  elevated mood, decreased need for sleep, racing thoughts, impulsive behavior  eye pain  fast, irregular heartbeat  fever or chills, sore throat  inability to keep still  males: prolonged or painful erection  problems with balance, talking, walking  redness, blistering, peeling, or loosening of the skin, including inside the mouth  seizures  signs and symptoms of high blood sugar such as being more thirsty or hungry or having to urinate more than normal. You may also feel very tired or have blurry vision  signs and symptoms of hypothyroidism like fatigue; increased sensitivity to cold; weight gain; hoarseness; thinning hair  signs and symptoms of low blood pressure like dizziness; feeling faint or lightheaded; falls; unusually weak or tired  signs and symptoms of neuroleptic malignant syndrome like confusion; fast, irregular heartbeat; high fever; increased sweating; stiff muscles  sudden numbness or weakness of the face, arm, or leg  suicidal thoughts or other mood changes  trouble swallowing  uncontrollable movements of the arms, face, head, mouth, neck, or upper body Side effects that usually do not require medical attention (report to your doctor or health care professional if they continue or are bothersome):  change in sex drive or performance  constipation  drowsiness  dry mouth  upset stomach  weight gain This list may   not describe all possible side effects. Call your doctor for medical advice about side effects. You may report side effects to FDA at 1-800-FDA-1088. Where should I keep my medicine? Keep out of the reach of children. Store at room temperature between 15 and 30 degrees C (59 and 86 degrees F). Throw away any unused medicine after the expiration date. NOTE: This sheet is a summary. It may not cover all possible information. If you  have questions about this medicine, talk to your doctor, pharmacist, or health care provider.  2020 Elsevier/Gold Standard (2018-12-06 11:59:11)  

## 2019-05-31 NOTE — Progress Notes (Signed)
Established patient visit      Patient: Katelyn Baker   DOB: 1945-04-22   74 y.o. Female  MRN: [id number] Visit Date: 06/01/2019  Today's healthcare provider: [name], PA-C  Subjective:    Chief Complaint  Patient presents with  . Hallucinations   HPI Patient presents today for hallucinations. Patient's daughter said that she has been having them for a while but they have gotten much worse. She does have dementia. They are considering placement in a memory care facility due to having a harder time being able to stay and take care of her. She is beginning to require almost 24/7 care as she is starting to wander some. She is also having some behavioral changes and can become more agitated and angry compared to previous.    Patient Active Problem List   Diagnosis Date Noted  . Essential hypertension 04/07/2018  . Benign essential tremor 09/30/2017  . Late onset Alzheimer's disease without behavioral disturbance (HCC) 04/29/2017  . Abnormal weight loss 04/29/2015  . Urge incontinence of urine 12/17/2014  . Hypercholesteremia 08/19/2014  . History of colon polyps 08/19/2014  . 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 Avitaminosis D 10/02/2009  . Abnormal blood sugar 06/24/2007  . Abnormal LFTs 06/23/2007  . Acid reflux 12/16/2005  . Bowel disease 12/16/2005  . 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 GERD (gastroesophageal reflux disease)   . Hyperlipidemia   . Thyroid disease        Medications: Outpatient Medications Prior to Visit  Medication Sig  . Blood Pressure Monitoring (3 SERIES BP MONITOR/WRIST) DEVI To check blood pressure daily  . buPROPion (WELLBUTRIN XL) 150 MG 24 hr tablet Take 1 tablet (150 mg total) by mouth daily.  . Calcium Carbonate (CALCIUM 600 PO) Take 600 mg by mouth daily.   Marland Kitchen donepezil (ARICEPT) 5 MG tablet Take 1 tablet (5 mg total) by mouth at bedtime.  Marland Kitchen levothyroxine  (SYNTHROID) 100 MCG tablet Take 1 tablet (100 mcg total) by mouth daily.  Marland Kitchen lisinopril (ZESTRIL) 10 MG tablet Take 1 tablet (10 mg total) by mouth daily.  . Multiple Vitamin (MULTIVITAMIN) tablet Take 1 tablet by mouth daily.  Marland Kitchen VITAMIN D, ERGOCALCIFEROL, PO Take 2,000 Int'l Units by mouth daily.    No facility-administered medications prior to visit.    Review of Systems  Constitutional: Negative.   Respiratory: Negative.   Cardiovascular: Negative.   Neurological: Negative.   Psychiatric/Behavioral: Positive for agitation, behavioral problems, confusion and hallucinations.    Last CBC Lab Results  Component Value Date   WBC 5.6 04/05/2019   HGB 13.6 04/05/2019   HCT 38.2 04/05/2019   MCV 93 04/05/2019   MCH 33.1 (H) 04/05/2019   RDW 14.2 04/05/2019   PLT 151 04/05/2019   Last metabolic panel Lab Results  Component Value Date   GLUCOSE 94 04/05/2019   NA 139 04/05/2019   K 3.4 (L) 04/05/2019   CL 100 04/05/2019   CO2 26 04/05/2019   BUN 12 04/05/2019   CREATININE 1.14 (H) 04/05/2019   GFRNONAA 48 (L) 04/05/2019   GFRAA 56 (L) 04/05/2019   CALCIUM 8.9 04/05/2019   PROT 6.6 04/05/2019   ALBUMIN 4.5 04/05/2019   LABGLOB 2.1 04/05/2019   AGRATIO 2.1 04/05/2019   BILITOT 0.4 04/05/2019   ALKPHOS 73 04/05/2019   AST 36 04/05/2019   ALT 33 (H) 04/05/2019   Last lipids Lab Results  Component Value Date  CHOL 296 (H) 04/05/2019   HDL 81 04/05/2019   LDLCALC 185 (H) 04/05/2019   TRIG 165 (H) 04/05/2019   CHOLHDL 4.0 09/02/2017   Last hemoglobin A1c Lab Results  Component Value Date   HGBA1C 6.0 (H) 04/05/2019   Last thyroid functions Lab Results  Component Value Date   TSH 121.000 (H) 04/05/2019        Objective:    BP (!) 153/77 (BP Location: Right Arm, Patient Position: Sitting, Cuff Size: Normal)   Pulse 66   Temp (!) 97.3 F (36.3 C) (Temporal)   Wt 134 lb (60.8 kg)   BMI 23.74 kg/m  BP Readings from Last 3 Encounters:  05/31/19 (!) 153/77    04/05/19 (!) 165/91  05/05/18 (!) 141/79      Physical Exam Vitals reviewed.  Constitutional:      General: She is not in acute distress.    Appearance: Normal appearance. She is well-developed and normal weight. She is not ill-appearing or diaphoretic.  Neck:     Thyroid: No thyromegaly.     Vascular: No JVD.     Trachea: No tracheal deviation.  Cardiovascular:     Rate and Rhythm: Normal rate and regular rhythm.     Pulses: Normal pulses.     Heart sounds: Normal heart sounds. No murmur. No friction rub. No gallop.   Pulmonary:     Effort: Pulmonary effort is normal. No respiratory distress.     Breath sounds: Normal breath sounds. No wheezing or rales.  Musculoskeletal:     Cervical back: Normal range of motion and neck supple.     Right lower leg: No edema.     Left lower leg: No edema.  Lymphadenopathy:     Cervical: No cervical adenopathy.  Neurological:     Mental Status: She is alert.  Psychiatric:        Attention and Perception: She is attentive. She perceives visual (has been having visual hallucinations but none currently) hallucinations.        Mood and Affect: Mood and affect normal.        Speech: Speech normal.        Behavior: Behavior normal. Behavior is cooperative.        Cognition and Memory: Cognition is impaired. Memory is impaired.      Results for orders placed or performed in visit on 05/31/19  POCT urinalysis dipstick  Result Value Ref Range   Color, UA     Clarity, UA     Glucose, UA Negative Negative   Bilirubin, UA small    Ketones, UA negative    Spec Grav, UA >=1.030 (A) 1.010 - 1.025   Blood, UA negative    pH, UA 6.0 5.0 - 8.0   Protein, UA Negative Negative   Urobilinogen, UA 0.2 0.2 or 1.0 E.U./dL   Nitrite, UA negative    Leukocytes, UA Moderate (2+) (A) Negative   Appearance     Odor        Assessment & Plan:    1. Mental confusion UA is positive for leukocytes but was concentrated as well. Will send urine for culture  as below. Will start Keflex for possible infection that may be exacerbating the hallucinations and confusion. Will also start Seroquel as below to see if that helps to lessen the hallucinations. Continue Aricept 5mg  for now. Will ask , LCSW for assistance with possible placement. FL2 form was completed today. Daughter has original and a copy will be  scanned into her chart for our records.   2. Possible urinary tract infection See above medical treatment plan. - cephALEXin (KEFLEX) 500 MG capsule; Take 1 capsule (500 mg total) by mouth 2 (two) times daily.  Dispense: 14 capsule; Refill: 0 - Urine Culture  3. Late onset Alzheimer's disease without behavioral disturbance ([place]) See above medical treatment plan. - QUEtiapine (SEROQUEL) 25 MG tablet; Take 1 tablet (25 mg total) by mouth at bedtime.  Dispense: 30 tablet; Refill: 1'     [place], [place] [phone] (phone) [phone] (fax)  [place]

## 2019-06-03 LAB — URINE CULTURE

## 2019-06-04 ENCOUNTER — Encounter: Payer: Self-pay | Admitting: Physician Assistant

## 2019-06-04 ENCOUNTER — Telehealth: Payer: Self-pay

## 2019-06-04 DIAGNOSIS — F0280 Dementia in other diseases classified elsewhere without behavioral disturbance: Secondary | ICD-10-CM

## 2019-06-04 NOTE — Telephone Encounter (Signed)
-----   Message from [name], [place] sent at 06/03/2019  1:46 PM EDT ----- Urine culture negative, can stop antibiotic. Most likely was false reading from urine being concentrated. Make sure she drinks plenty of fluids.

## 2019-06-04 NOTE — Telephone Encounter (Signed)
Patient's daughter French Ana (on [place]) advised of results.

## 2019-06-05 MED ORDER — QUETIAPINE FUMARATE 25 MG PO TABS: 25.0000 mg | ORAL_TABLET | Freq: Every day | ORAL | 1 refills | Status: DC

## 2019-06-08 ENCOUNTER — Ambulatory Visit: Payer: Self-pay | Admitting: *Deleted

## 2019-06-11 NOTE — Chronic Care Management (AMB) (Signed)
Chronic Care Management    Clinical Social Work General Note  06/11/2019 Name: Katelyn Baker MRN: [id number] DOB: Aug 01, 1945  Katelyn Baker is a 74 y.o. year old female who is a primary care patient of Mar Daring, [place]. The CCM was consulted to assist the patient with [employer] .   Ms. Cudney daughter was given information about Chronic Care Management services today including:  1. CCM service includes personalized support from designated clinical staff supervised by her physician, including individualized plan of care and coordination with other care providers 2. 24/7 contact phone numbers for assistance for urgent and routine care needs. 3. Service will only be billed when office clinical staff spend 20 minutes or more in a month to coordinate care. 4. Only one practitioner may furnish and bill the service in a calendar month. 5. The patient may stop CCM services at any time (effective at the end of the month) by phone call to the office staff. 6. The patient will be responsible for cost sharing (co-pay) of up to 20% of the service fee (after annual deductible is met).  Patient's daughter did not agree to services at this time. Per patient's daughter, patient's condition is beginning to progress and she is feeling that she may need out of home placement at some point in the future. Per patient's spouse, he is reluctant to make this decision, however knows that eventually placement may need to be required. This Education officer, museum discussed the possibility of in home assistance, Adult Day Care options and placement options. This Education officer, museum further recommended that patient's daughter start the long term medicaid process as it takes 45 days to process. This Education officer, museum also provided patient's daughter with the information for an upcoming caregiver resource fair.  Patient's daughter appreciative of the information provided and will call this social worker with any additional  questions or concerns.      Review of patient status, including review of consultants reports, relevant laboratory and other test results, and collaboration with appropriate care team members and the patient's provider was performed as part of comprehensive patient evaluation and provision of chronic care management services.    SDOH (Social Determinants of Health) assessments and interventions performed:  No    Outpatient Encounter Medications as of 06/08/2019  Medication Sig Note  . Blood Pressure Monitoring (3 SERIES BP MONITOR/WRIST) DEVI To check blood pressure daily   . buPROPion (WELLBUTRIN XL) 150 MG 24 hr tablet Take 1 tablet (150 mg total) by mouth daily.   . Calcium Carbonate (CALCIUM 600 PO) Take 600 mg by mouth daily.    . cephALEXin (KEFLEX) 500 MG capsule Take 1 capsule (500 mg total) by mouth 2 (two) times daily.   Marland Kitchen donepezil (ARICEPT) 5 MG tablet Take 1 tablet (5 mg total) by mouth at bedtime.   Marland Kitchen levothyroxine (SYNTHROID) 100 MCG tablet Take 1 tablet (100 mcg total) by mouth daily.   Marland Kitchen lisinopril (ZESTRIL) 10 MG tablet Take 1 tablet (10 mg total) by mouth daily.   . Multiple Vitamin (MULTIVITAMIN) tablet Take 1 tablet by mouth daily.   . QUEtiapine (SEROQUEL) 25 MG tablet Take 1 tablet (25 mg total) by mouth at bedtime.   Marland Kitchen VITAMIN D, ERGOCALCIFEROL, PO Take 2,000 Int'l Units by mouth daily.  08/19/2014: Received from: [employer]  . [DISCONTINUED] levothyroxine (SYNTHROID, LEVOTHROID) 100 MCG tablet TAKE 1 TABLET BY MOUTH ONCE DAILY    No facility-administered encounter medications on file as of 06/08/2019.  Goals Addressed   None      Follow Up Plan: Client's daughter will call this social worker with any questions or concerns regarding her mother's community resource needs        [employer], [place] Worker  [facility] Care [facility] [phone]

## 2019-06-29 ENCOUNTER — Encounter: Payer: Self-pay | Admitting: Physician Assistant

## 2019-06-29 ENCOUNTER — Ambulatory Visit (INDEPENDENT_AMBULATORY_CARE_PROVIDER_SITE_OTHER): Payer: Medicare PPO | Admitting: Physician Assistant

## 2019-06-29 DIAGNOSIS — F0280 Dementia in other diseases classified elsewhere without behavioral disturbance: Secondary | ICD-10-CM

## 2019-06-29 DIAGNOSIS — G301 Alzheimer's disease with late onset: Secondary | ICD-10-CM

## 2019-06-29 MED ORDER — QUETIAPINE FUMARATE 25 MG PO TABS: 25.0000 mg | ORAL_TABLET | Freq: Every day | ORAL | 1 refills | Status: DC

## 2019-06-29 NOTE — Progress Notes (Signed)
Virtual telephone visit    Virtual Visit via Telephone Note   This visit type was conducted due to national recommendations for restrictions regarding the COVID-19 Pandemic (e.g. social distancing) in an effort to limit this patient's exposure and mitigate transmission in our community. Due to her co-morbid illnesses, this patient is at least at moderate risk for complications without adequate follow up. This format is felt to be most appropriate for this patient at this time. The patient did not have access to video technology or had technical difficulties with video requiring transitioning to audio format only (telephone). Physical exam was limited to content and character of the telephone converstion.    Patient location: Home Provider location: [facility]  Visit was completed with patient's daughter French Ana due to patient's dementia Visit Date: 06/29/2019  Today's healthcare provider: [name], PA-C   Chief Complaint  Patient presents with  . Follow-up   Subjective    HPI  Follow up for Dementia  The patient was last seen for this 4 weeks ago. Changes made at last visit include start Seroquel.  She reports fair compliance with treatment. She feels that condition is Unchanged. Patient takes medicine off and on and has hidden the medicines and her daughter French Ana reports that they cannot find it. French Ana reports when she was on it consistently they had noticed a difference in her behaviors for the better.  -----------------------------------------------------------------------------------------   Patient Active Problem List   Diagnosis Date Noted  . Essential hypertension 04/07/2018  . Benign essential tremor 09/30/2017  . Late onset Alzheimer's disease without behavioral disturbance (HCC) 04/29/2017  . Abnormal weight loss 04/29/2015  . Urge incontinence of urine 12/17/2014  . Hypercholesteremia 08/19/2014  . History of colon polyps 08/19/2014  . Female proctocele  without uterine prolapse 08/19/2014  . Avitaminosis D 10/02/2009  . Abnormal blood sugar 06/24/2007  . Abnormal LFTs 06/23/2007  . Acid reflux 12/16/2005  . Bowel disease 12/16/2005  . 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 GERD (gastroesophageal reflux disease)   . Hyperlipidemia   . Thyroid disease       Medications: Outpatient Medications Prior to Visit  Medication Sig  . buPROPion (WELLBUTRIN XL) 150 MG 24 hr tablet Take 1 tablet (150 mg total) by mouth daily.  . Calcium Carbonate (CALCIUM 600 PO) Take 600 mg by mouth daily.   . cephALEXin (KEFLEX) 500 MG capsule Take 1 capsule (500 mg total) by mouth 2 (two) times daily.  Marland Kitchen donepezil (ARICEPT) 5 MG tablet Take 1 tablet (5 mg total) by mouth at bedtime.  Marland Kitchen levothyroxine (SYNTHROID) 100 MCG tablet Take 1 tablet (100 mcg total) by mouth daily.  Marland Kitchen lisinopril (ZESTRIL) 10 MG tablet Take 1 tablet (10 mg total) by mouth daily.  . Multiple Vitamin (MULTIVITAMIN) tablet Take 1 tablet by mouth daily.  . QUEtiapine (SEROQUEL) 25 MG tablet Take 1 tablet (25 mg total) by mouth at bedtime.  Marland Kitchen VITAMIN D, ERGOCALCIFEROL, PO Take 2,000 Int'l Units by mouth daily.   . Blood Pressure Monitoring (3 SERIES BP MONITOR/WRIST) DEVI To check blood pressure daily (Patient not taking: Reported on 06/29/2019)   No facility-administered medications prior to visit.    Review of Systems  Last CBC Lab Results  Component Value Date   WBC 5.6 04/05/2019   HGB 13.6 04/05/2019   HCT 38.2 04/05/2019   MCV 93 04/05/2019   MCH 33.1 (H) 04/05/2019   RDW 14.2 04/05/2019   PLT  151 48/27/0786   Last metabolic panel Lab Results  Component Value Date   GLUCOSE 94 04/05/2019   NA 139 04/05/2019   K 3.4 (L) 04/05/2019   CL 100 04/05/2019   CO2 26 04/05/2019   BUN 12 04/05/2019   CREATININE 1.14 (H) 04/05/2019   GFRNONAA 48 (L) 04/05/2019   GFRAA 56 (L) 04/05/2019   CALCIUM 8.9 04/05/2019   PROT 6.6 04/05/2019    ALBUMIN 4.5 04/05/2019   LABGLOB 2.1 04/05/2019   AGRATIO 2.1 04/05/2019   BILITOT 0.4 04/05/2019   ALKPHOS 73 04/05/2019   AST 36 04/05/2019   ALT 33 (H) 04/05/2019      Objective    There were no vitals taken for this visit. BP Readings from Last 3 Encounters:  05/31/19 (!) 153/77  04/05/19 (!) 165/91  05/05/18 (!) 141/79   Wt Readings from Last 3 Encounters:  05/31/19 134 lb (60.8 kg)  04/05/19 147 lb 3.2 oz (66.8 kg)  05/05/18 131 lb (59.4 kg)        Assessment & Plan     1. Late onset Alzheimer's disease without behavioral disturbance (HCC) Seroquel was helping. Medication refilled since old Rx has been misplaced. They are working on possible placement for her, but currently patient's husband is wanting to keep her at home. CCM LCSW is involved and has been extremely helpful. Daughter Olivia Mackie voices much thanks.  - QUEtiapine (SEROQUEL) 25 MG tablet; Take 1 tablet (25 mg total) by mouth at bedtime.  Dispense: 90 tablet; Refill: 1   No follow-ups on file.    I discussed the assessment and treatment plan with the patient. The patient was provided an opportunity to ask questions and all were answered. The patient agreed with the plan and demonstrated an understanding of the instructions.   The patient was advised to call back or seek an in-person evaluation if the symptoms worsen or if the condition fails to improve as anticipated.  I provided 8 minutes of non-face-to-face time during this encounter.  Reynolds Bowl, PA-C, have reviewed all documentation for this visit. The documentation on 07/04/19 for the exam, diagnosis, procedures, and orders are all accurate and complete.  [place] [facility] [phone] (phone) [phone] (fax)  [place]

## 2019-07-04 ENCOUNTER — Encounter: Payer: Self-pay | Admitting: Physician Assistant

## 2019-07-09 ENCOUNTER — Encounter: Payer: Self-pay | Admitting: Physician Assistant

## 2019-07-12 ENCOUNTER — Ambulatory Visit: Payer: Self-pay | Admitting: *Deleted

## 2019-07-13 NOTE — Chronic Care Management (AMB) (Signed)
  Chronic Care Management   Social Work Note  07/13/2019 Name: Katelyn Baker MRN: [id number] DOB: 23-May-1945  Katelyn Baker is a 74 y.o. year old female who sees Rosezetta Schlatter, Alessandra Bevels, [place] for primary care. The CCM team was consulted for assistance with Level of Care Concerns.   Phone call from patient's daughter with questions regarding the completion of the long term care medicaid application. Questions answered regarding income limits, however it was suggested that she refer to the Department of Social Services for specific information.  SDOH (Social Determinants of Health) assessments performed: No     Outpatient Encounter Medications as of 07/12/2019  Medication Sig Note  . Blood Pressure Monitoring (3 SERIES BP MONITOR/WRIST) DEVI To check blood pressure daily (Patient not taking: Reported on 06/29/2019)   . buPROPion (WELLBUTRIN XL) 150 MG 24 hr tablet Take 1 tablet (150 mg total) by mouth daily.   . Calcium Carbonate (CALCIUM 600 PO) Take 600 mg by mouth daily.    Marland Kitchen donepezil (ARICEPT) 5 MG tablet Take 1 tablet (5 mg total) by mouth at bedtime.   Marland Kitchen levothyroxine (SYNTHROID) 100 MCG tablet Take 1 tablet (100 mcg total) by mouth daily.   Marland Kitchen lisinopril (ZESTRIL) 10 MG tablet Take 1 tablet (10 mg total) by mouth daily.   . Multiple Vitamin (MULTIVITAMIN) tablet Take 1 tablet by mouth daily.   . QUEtiapine (SEROQUEL) 25 MG tablet Take 1 tablet (25 mg total) by mouth at bedtime.   Marland Kitchen VITAMIN D, ERGOCALCIFEROL, PO Take 2,000 Int'l Units by mouth daily.  08/19/2014: Received from: [employer]  . [DISCONTINUED] levothyroxine (SYNTHROID, LEVOTHROID) 100 MCG tablet TAKE 1 TABLET BY MOUTH ONCE DAILY    No facility-administered encounter medications on file as of 07/12/2019.    Goals Addressed   None     Follow Up Plan: Client's daughter will will call this social worker with assistance needs for palcement   Verna Czech, LCSW Clinical Social Worker  [facility] [phone]

## 2019-07-25 ENCOUNTER — Telehealth: Payer: Self-pay | Admitting: Physician Assistant

## 2019-07-25 NOTE — Telephone Encounter (Signed)
Error

## 2019-08-09 ENCOUNTER — Encounter: Payer: Self-pay | Admitting: Physician Assistant

## 2019-08-22 ENCOUNTER — Encounter: Payer: Self-pay | Admitting: Physician Assistant

## 2019-08-22 NOTE — Telephone Encounter (Signed)
Yes this is ok 

## 2019-08-23 ENCOUNTER — Telehealth: Payer: Self-pay

## 2019-08-23 NOTE — Telephone Encounter (Signed)
noted 

## 2019-08-23 NOTE — Telephone Encounter (Signed)
Copied from CRM [id number]. Topic: General - Inquiry >> Aug 23, 2019  2:12 PM Leary Roca wrote: Reason for CRM: Pt daughter will reach out next week regarding pts appt . No need to set up appt for tomorrow July 2nd . Please advise

## 2019-08-24 NOTE — Telephone Encounter (Signed)
Yes it is ok to schedule her next Friday.

## 2019-08-31 ENCOUNTER — Ambulatory Visit (INDEPENDENT_AMBULATORY_CARE_PROVIDER_SITE_OTHER): Payer: Medicare PPO | Admitting: Physician Assistant

## 2019-08-31 ENCOUNTER — Encounter: Payer: Self-pay | Admitting: Physician Assistant

## 2019-08-31 DIAGNOSIS — F0281 Dementia in other diseases classified elsewhere with behavioral disturbance: Secondary | ICD-10-CM | POA: Diagnosis not present

## 2019-08-31 DIAGNOSIS — G301 Alzheimer's disease with late onset: Secondary | ICD-10-CM

## 2019-08-31 DIAGNOSIS — F02818 Dementia in other diseases classified elsewhere, unspecified severity, with other behavioral disturbance: Secondary | ICD-10-CM

## 2019-08-31 MED ORDER — QUETIAPINE FUMARATE 50 MG PO TABS: 50.0000 mg | ORAL_TABLET | Freq: Every day | ORAL | 0 refills | Status: DC

## 2019-08-31 MED ORDER — NAMZARIC 7-10 MG PO CP24: 1.0000 | ORAL_CAPSULE | Freq: Every day | ORAL | 0 refills | Status: DC

## 2019-08-31 NOTE — Progress Notes (Addendum)
Virtual telephone visit    Virtual Visit via Telephone Note   This visit type was conducted due to national recommendations for restrictions regarding the COVID-19 Pandemic (e.g. social distancing) in an effort to limit this patient's exposure and mitigate transmission in our community. Due to her co-morbid illnesses, this patient is at least at moderate risk for complications without adequate follow up. This format is felt to be most appropriate for this patient at this time. The patient did not have access to video technology or had technical difficulties with video requiring transitioning to audio format only (telephone). Physical exam was limited to content and character of the telephone converstion.   Due to patient dementia appt was performed with her daughter and Katelyn Baker.  I connected with Katelyn Baker on 09/05/19 at 11:00 AM EDT by telephone and verified that I am speaking with the correct person using two identifiers.  I discussed the limitations, risks, security and privacy concerns of performing an evaluation and management service by telephone and the availability of in person appointments. I also discussed with the patient that there may be a patient responsible charge related to this service. The patient expressed understanding and agreed to proceed.  Patient location: Home Provider location: [facility]   Visit Date: 08/31/2019  Today's healthcare provider: [name], PA-C   Chief Complaint  Patient presents with  . Dementia   Subjective    HPI  Patient's daughter Katelyn Baker has concerns about her mothers medical problems. She would like to discuss worsening Dementia.   Katelyn Baker is starting to hallucinate more and is having more issues with sleep. Katelyn Baker, daughter, has been working on possible placement options for when her father is ready to move forward with memory care placement. This has been halted as a LCSW has told them that they will have to most likely pay  up front for many months until Medicare approves placement. Thus they are trying to keep [place] home and just have supervision for her for the majority of the time. They have also bought a medication lock box so her mother cannot hide her medications or flush them as she has done in the past. This helps keep her medications being consistently taken.    Patient Active Problem List   Diagnosis Date Noted  . Essential hypertension 04/07/2018  . Benign essential tremor 09/30/2017  . Late onset Alzheimer's disease without behavioral disturbance (HCC) 04/29/2017  . Abnormal weight loss 04/29/2015  . Urge incontinence of urine 12/17/2014  . Hypercholesteremia 08/19/2014  . History of colon polyps 08/19/2014  . 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 Avitaminosis D 10/02/2009  . Abnormal blood sugar 06/24/2007  . Abnormal LFTs 06/23/2007  . Acid reflux 12/16/2005  . Bowel disease 12/16/2005  . 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 GERD (gastroesophageal reflux disease)   . Hyperlipidemia   . Thyroid disease       Medications: Outpatient Medications Prior to Visit  Medication Sig  . Blood Pressure Monitoring (3 SERIES BP MONITOR/WRIST) DEVI To check blood pressure daily (Patient not taking: Reported on 06/29/2019)  . buPROPion (WELLBUTRIN XL) 150 MG 24 hr tablet Take 1 tablet (150 mg total) by mouth daily.  . Calcium Carbonate (CALCIUM 600 PO) Take 600 mg by mouth daily.   Marland Kitchen donepezil (ARICEPT) 5 MG tablet Take 1 tablet (5 mg total) by mouth at bedtime.  Marland Kitchen levothyroxine (SYNTHROID) 100 MCG tablet Take 1 tablet (100  mcg total) by mouth daily.  Marland Kitchen lisinopril (ZESTRIL) 10 MG tablet Take 1 tablet (10 mg total) by mouth daily.  . Multiple Vitamin (MULTIVITAMIN) tablet Take 1 tablet by mouth daily.  . QUEtiapine (SEROQUEL) 25 MG tablet Take 1 tablet (25 mg total) by mouth at bedtime.  Marland Kitchen VITAMIN D, ERGOCALCIFEROL, PO Take 2,000 Int'l Units by  mouth daily.    No facility-administered medications prior to visit.    Review of Systems  Constitutional: Negative.   Respiratory: Negative.   Cardiovascular: Negative.   Psychiatric/Behavioral: Positive for agitation, behavioral problems, hallucinations and sleep disturbance.    Last CBC Lab Results  Component Value Date   WBC 5.6 04/05/2019   HGB 13.6 04/05/2019   HCT 38.2 04/05/2019   MCV 93 04/05/2019   MCH 33.1 (H) 04/05/2019   RDW 14.2 04/05/2019   PLT 151 04/05/2019   Last metabolic panel Lab Results  Component Value Date   GLUCOSE 94 04/05/2019   NA 139 04/05/2019   K 3.4 (L) 04/05/2019   CL 100 04/05/2019   CO2 26 04/05/2019   BUN 12 04/05/2019   CREATININE 1.14 (H) 04/05/2019   GFRNONAA 48 (L) 04/05/2019   GFRAA 56 (L) 04/05/2019   CALCIUM 8.9 04/05/2019   PROT 6.6 04/05/2019   ALBUMIN 4.5 04/05/2019   LABGLOB 2.1 04/05/2019   AGRATIO 2.1 04/05/2019   BILITOT 0.4 04/05/2019   ALKPHOS 73 04/05/2019   AST 36 04/05/2019   ALT 33 (H) 04/05/2019      Objective    There were no vitals taken for this visit. BP Readings from Last 3 Encounters:  05/31/19 (!) 153/77  04/05/19 (!) 165/91  05/05/18 (!) 141/79   Wt Readings from Last 3 Encounters:  05/31/19 134 lb (60.8 kg)  04/05/19 147 lb 3.2 oz (66.8 kg)  05/05/18 131 lb (59.4 kg)        Assessment & Plan     Problem List Items Addressed This Visit      Nervous and Auditory   Late onset Alzheimer's disease with behavioral disturbance (HCC) - Primary    Hallucinations worsening and having more trouble with nights and days; staying up more. Increase Seroquel to 50mg  from 25mg . Change donepezil 10mg  to Namzaric (memantine-donepezil 7-10mg ). F/U in 4 weeks with titration increase of Namzaric to 14-10mg  and consideration of seroquel increase if needed.      Relevant Medications   QUEtiapine (SEROQUEL) 50 MG tablet   Memantine HCl-Donepezil HCl (NAMZARIC) 7-10 MG CP24      No follow-ups on  file.    I discussed the assessment and treatment plan with the patient. The patient was provided an opportunity to ask questions and all were answered. The patient agreed with the plan and demonstrated an understanding of the instructions.   The patient was advised to call back or seek an in-person evaluation if the symptoms worsen or if the condition fails to improve as anticipated.  I provided 18 minutes of non-face-to-face time during this encounter.  , PA-C, have reviewed all documentation for this visit. The documentation on 09/03/19 for the exam, diagnosis, procedures, and orders are all accurate and complete.  [facility] [phone] (phone) [phone] (fax)  [facility]

## 2019-08-31 NOTE — Patient Instructions (Signed)
Donepezil; Memantine extended-release capsule What is this medicine? DONEPEZIL; MEMANTINE (doe NEP e zil; MEM an teen) is used to treat dementia caused by Alzheimer's disease. This medicine may be used for other purposes; ask your health care provider or pharmacist if you have questions. COMMON BRAND NAME(S): Namzaric What should I tell my health care provider before I take this medicine? They need to know if you have any of these conditions:  difficulty passing urine  head injury  heart disease  irregular heartbeat  kidney disease  liver disease  lung or breathing disease, like asthma  seizures  stomach or intestinal disease, ulcers or stomach bleeding  an unusual or allergic reaction to donepezil, memantine, other medicines, foods, dyes, or preservatives  pregnant or trying to get pregnant  breast-feeding How should I use this medicine? Take this medicine by mouth with a glass of water. Follow the directions on the prescription label. You may take this medicine with or without food. Swallow the capsules whole. Do not chew or crush. If swallowing is difficult, you may open the capsules and sprinkle the entire contents on cool applesauce before swallowing. Take your doses at regular intervals. Do not take your medicine more often than directed. Continue to take your medicine even if you feel better. Do not stop taking except on the advice of your doctor or health care professional. Talk to your pediatrician regarding the use of this medicine in children. Special care may be needed. Overdosage: If you think you have taken too much of this medicine contact a poison control center or emergency room at once. NOTE: This medicine is only for you. Do not share this medicine with others. What if I miss a dose? If you miss a dose, take it as soon as you can. If it is almost time for your next dose, take only that dose. Do not take double or extra doses. If you do not take your medicine  for several days, contact your health care provider. Your dose may need to be changed. What may interact with this medicine? Do not take this medicine with any of the following medications:  certain medicines for fungal infections like itraconazole, fluconazole, posaconazole, and voriconazole  cisapride  dextromethorphan; quinidine  dronedarone  pimozide  quinidine  thioridazine This medicine may also interact with the following medications:  acetazolamide  antihistamines for allergy, cough and cold  atropine  bethanechol  carbamazepine  certain medicines for bladder problems like oxybutynin, tolterodine  certain medicines for Parkinson's disease like benztropine, trihexyphenidyl  certain medicines for stomach problems like dicyclomine, hyoscyamine  certain medicines for travel sickness like scopolamine  cimetidine  dexamethasone  dofetilide  hydrochlorothiazide (HCTZ)  ketamine  ipratropium  metformin  methazolamide  nicotine  NSAIDs, medicines for pain and inflammation, like ibuprofen or naproxen  other medicines for Alzheimer's disease  other medicines that prolong the QT interval (cause an abnormal heart rhythm)  phenobarbital  phenytoin  ranitidine  rifampin, rifabutin or rifapentine  sodium bicarbonate  succinylcholine  triamterene  ziprasidone This list may not describe all possible interactions. Give your health care provider a list of all the medicines, herbs, non-prescription drugs, or dietary supplements you use. Also tell them if you smoke, drink alcohol, or use illegal drugs. Some items may interact with your medicine. What should I watch for while using this medicine? Visit your doctor or health care professional for regular checks on your progress. Check with your doctor or health care professional if there is no improvement in   your symptoms or if they get worse. You may get drowsy or dizzy. Do not drive, use machinery, or  do anything that needs mental alertness until you know how this drug affects you. Do not stand or sit up quickly, especially if you are an older patient. This reduces the risk of dizzy or fainting spells. Alcohol can make you more drowsy and dizzy. Avoid alcoholic drinks. If you are going to need surgery or other procedure, tell your doctor that you are using this medicine. What side effects may I notice from receiving this medicine? Side effects that you should report to your doctor or health care professional as soon as possible:  allergic reactions like skin rash, itching or hives, swelling of the face, lips, or tongue  changes in vision  depressed mood  feeling faint or lightheaded, falls  hallucinations  problems with balance  redness, blistering, peeling or loosening of the skin, including inside the mouth  seizures  slow heartbeat, or palpitations  stomach pain  unusual bleeding or bruising, red or purple spots on the skin  vomiting  weight loss Side effects that usually do not require medical attention (report to your doctor or health care professional if they continue or are bothersome):  diarrhea  dizziness  headache  indigestion or heartburn  loss of appetite  nausea This list may not describe all possible side effects. Call your doctor for medical advice about side effects. You may report side effects to FDA at 1-800-FDA-1088. Where should I keep my medicine? Keep out of the reach of children. Store at room temperature between 15 and 30 degrees C (59 and 86 degrees F). Throw away any unused medicine after the expiration date. NOTE: This sheet is a summary. It may not cover all possible information. If you have questions about this medicine, talk to your doctor, pharmacist, or health care provider.  2020 Elsevier/Gold Standard (2018-01-30 10:35:58)  

## 2019-09-03 NOTE — Assessment & Plan Note (Signed)
Hallucinations worsening and having more trouble with nights and days; staying up more. Increase Seroquel to 50mg  from 25mg . Change donepezil 10mg  to Namzaric (memantine-donepezil 7-10mg ). F/U in 4 weeks with titration increase of Namzaric to 14-10mg  and consideration of seroquel increase if needed.

## 2019-09-21 ENCOUNTER — Telehealth: Payer: Self-pay | Admitting: Physician Assistant

## 2019-09-21 NOTE — Telephone Encounter (Signed)
Copied from CRM [id number]. Topic: Medicare AWV >> Sep 21, 2019 12:00 PM Claudette Laws R wrote: Reason for CRM:  Left message for patient to call back and schedule Medicare Annual Wellness Visit (AWV) either virtually or in office.  Last AWV 08/15/2018  Please schedule at anytime with [facility] Health Advisor.

## 2019-09-25 NOTE — Telephone Encounter (Signed)
Pts daughter called and is not sure what all this visit will entail and is hesitant to schedule and is requesting to have a call back. Please advise.

## 2019-10-01 ENCOUNTER — Encounter: Payer: Self-pay | Admitting: Physician Assistant

## 2019-10-04 ENCOUNTER — Encounter: Payer: Self-pay | Admitting: Physician Assistant

## 2019-10-04 ENCOUNTER — Other Ambulatory Visit: Payer: Self-pay | Admitting: Physician Assistant

## 2019-10-04 ENCOUNTER — Telehealth (INDEPENDENT_AMBULATORY_CARE_PROVIDER_SITE_OTHER): Payer: Medicare PPO | Admitting: Physician Assistant

## 2019-10-04 DIAGNOSIS — F0281 Dementia in other diseases classified elsewhere with behavioral disturbance: Secondary | ICD-10-CM

## 2019-10-04 DIAGNOSIS — F02818 Dementia in other diseases classified elsewhere, unspecified severity, with other behavioral disturbance: Secondary | ICD-10-CM

## 2019-10-04 DIAGNOSIS — G301 Alzheimer's disease with late onset: Secondary | ICD-10-CM | POA: Diagnosis not present

## 2019-10-04 NOTE — Progress Notes (Signed)
Virtual Visit via Telephone Note  I connected with Katelyn Baker on 10/04/19 at  2:20 PM EDT by telephone and verified that I am speaking with the correct person using two identifiers. Visit was done with patient's daughter, [facility]), due to patient's dementia.  Location: Patient: Home Provider: [facility]   I discussed the limitations, risks, security and privacy concerns of performing an evaluation and management service by telephone and the availability of in person appointments. I also discussed with the patient that there may be a patient responsible charge related to this service. The patient expressed understanding and agreed to proceed.   Katelyn Loveless, PA-C   Established patient visit   Patient: Katelyn Baker   DOB: Nov 27, 1945   74 y.o. Female  MRN: [id number] Visit Date: 10/04/2019  Today's healthcare provider: [name], PA-C   Chief Complaint  Patient presents with  . Dementia   I,Katelyn Baker,acting as a Neurosurgeon for [employer], PA-C.,have documented all relevant documentation on the behalf of Katelyn Loveless, PA-C,as directed by  Katelyn Loveless, PA-C while in the presence of Katelyn Baker, [place].  Subjective    HPI  Follow up for Late onset Alzheimer's disease with behavioral disturbance:  The patient was last seen for this 1 months ago. Changes made at last visit include changed Donepezil to Namzaric and increased Seroquel from 25 mg to 50 mg.  She reports good compliance with treatment. She feels that condition is Unchanged. She is not having side effects.   -----------------------------------------------------------------------------------------  Patient Active Problem List   Diagnosis Date Noted  . Essential hypertension 04/07/2018  . Benign essential tremor 09/30/2017  . Late onset Alzheimer's disease with behavioral disturbance (HCC) 04/29/2017  . Abnormal weight loss 04/29/2015  . Urge  incontinence of urine 12/17/2014  . Hypercholesteremia 08/19/2014  . History of colon polyps 08/19/2014  . 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 Avitaminosis D 10/02/2009  . Abnormal blood sugar 06/24/2007  . Abnormal LFTs 06/23/2007  . Acid reflux 12/16/2005  . Bowel disease 12/16/2005  . 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 GERD (gastroesophageal reflux disease)   . Hyperlipidemia   . Thyroid disease        Medications: Outpatient Medications Prior to Visit  Medication Sig  . buPROPion (WELLBUTRIN XL) 150 MG 24 hr tablet Take 1 tablet (150 mg total) by mouth daily.  . Calcium Carbonate (CALCIUM 600 PO) Take 600 mg by mouth daily.   Marland Kitchen levothyroxine (SYNTHROID) 100 MCG tablet Take 1 tablet (100 mcg total) by mouth daily.  Marland Kitchen lisinopril (ZESTRIL) 10 MG tablet Take 1 tablet (10 mg total) by mouth daily.  . Memantine HCl-Donepezil HCl (NAMZARIC) 7-10 MG CP24 Take 1 capsule by mouth at bedtime.  . Multiple Vitamin (MULTIVITAMIN) tablet Take 1 tablet by mouth daily.  . QUEtiapine (SEROQUEL) 50 MG tablet Take 1 tablet (50 mg total) by mouth at bedtime.  Marland Kitchen VITAMIN D, ERGOCALCIFEROL, PO Take 2,000 Int'l Units by mouth daily.   . Blood Pressure Monitoring (3 SERIES BP MONITOR/WRIST) DEVI To check blood pressure daily (Patient not taking: Reported on 06/29/2019)   No facility-administered medications prior to visit.    Review of Systems  Constitutional: Negative.   Respiratory: Negative.   Cardiovascular: Negative.   Musculoskeletal: Negative.   Neurological:       Dementia   Psychiatric/Behavioral: Positive for behavioral problems.    Last CBC Lab Results  Component Value Date   WBC 5.6 04/05/2019   HGB 13.6 04/05/2019   HCT 38.2 04/05/2019   MCV 93 04/05/2019   MCH 33.1 (H) 04/05/2019   RDW 14.2 04/05/2019   PLT 151 04/05/2019   Last metabolic panel Lab Results  Component Value Date   GLUCOSE 94 04/05/2019    NA 139 04/05/2019   K 3.4 (L) 04/05/2019   CL 100 04/05/2019   CO2 26 04/05/2019   BUN 12 04/05/2019   CREATININE 1.14 (H) 04/05/2019   GFRNONAA 48 (L) 04/05/2019   GFRAA 56 (L) 04/05/2019   CALCIUM 8.9 04/05/2019   PROT 6.6 04/05/2019   ALBUMIN 4.5 04/05/2019   LABGLOB 2.1 04/05/2019   AGRATIO 2.1 04/05/2019   BILITOT 0.4 04/05/2019   ALKPHOS 73 04/05/2019   AST 36 04/05/2019   ALT 33 (H) 04/05/2019      Objective    There were no vitals taken for this visit. BP Readings from Last 3 Encounters:  05/31/19 (!) 153/77  04/05/19 (!) 165/91  05/05/18 (!) 141/79   Wt Readings from Last 3 Encounters:  05/31/19 134 lb (60.8 kg)  04/05/19 147 lb 3.2 oz (66.8 kg)  05/05/18 131 lb (59.4 kg)      Physical Exam    No results found for any visits on 10/04/19.  Assessment & Plan     1. Late onset Alzheimer's disease with behavioral disturbance (HCC) Worsening. Family is ready for long-term care. Patient has been wandering more to her neighbors home (use to be her mother's home) and they have called the cops and social services on her. FL2 forms were completed today and will be sent to start placement process.   No follow-ups on file.     I discussed the assessment and treatment plan with the patient. The patient was provided an opportunity to ask questions and all were answered. The patient agreed with the plan and demonstrated an understanding of the instructions.   The patient was advised to call back or seek an in-person evaluation if the symptoms worsen or if the condition fails to improve as anticipated.  I provided 11 minutes of non-face-to-face time during this  Encounter.   Katelyn Islam, PA-C, have reviewed all documentation for this visit. The documentation on 10/04/19 for the exam, diagnosis, procedures, and orders are all accurate and complete.   Katelyn Baker  [facility] [phone] (phone) [phone] (fax)  [facility]

## 2019-10-05 MED ORDER — QUETIAPINE FUMARATE 50 MG PO TABS: 50.0000 mg | ORAL_TABLET | Freq: Every day | ORAL | 1 refills | Status: DC

## 2019-10-05 MED ORDER — NAMZARIC 7-10 MG PO CP24: 1.0000 | ORAL_CAPSULE | Freq: Every day | ORAL | 1 refills | Status: DC

## 2019-10-08 ENCOUNTER — Telehealth: Payer: Self-pay

## 2019-10-08 ENCOUNTER — Encounter: Payer: Self-pay | Admitting: Physician Assistant

## 2019-10-08 NOTE — Telephone Encounter (Signed)
Spoke with French Ana on the phone who states that this an urgent matter that needs to be addressed today. She is asking for FL@ form and letter from [facility] be faxed to her ASAP because she never received them. She states that she will have her assistant come by office and pick up originals. I advised her that Josi and Antony Contras were out of office I will double check work station and accordion at front and refax documents, [employer]

## 2019-10-08 NOTE — Telephone Encounter (Signed)
Copied from CRM [id number]. Topic: General - Other >> Oct 08, 2019  8:11 AM Tamela Oddi wrote: Reason for CRM: Patient's daughter called to check the status of some forms that were supposed to be faxed to the following # [phone].  She stated she has not received them yet and said that she thinks Josie was supposed to fax them.  She would also like for her assistant to stop by the office to pick up the original forms.  Please advise and call to discuss at [phone]

## 2019-10-09 NOTE — Telephone Encounter (Signed)
Please see mychart message.

## 2019-10-10 NOTE — Telephone Encounter (Signed)
Daughter LM stating that she spoke with PCP about this apt last week and they decided this visit was not necessary due to placing pt in a long term care facility.

## 2019-10-18 ENCOUNTER — Telehealth: Payer: Self-pay | Admitting: Physician Assistant

## 2019-10-18 NOTE — Telephone Encounter (Signed)
Katelyn Baker calling from [facility] is returning a call regarding placement for Katelyn Baker. Please advise CB- [phone]

## 2019-10-19 NOTE — Telephone Encounter (Signed)
Per [name] in admission Katelyn Baker left a message yesterday to let provider know that they cannot accept Katelyn Baker because "she is a wonder" "meaning she can get out really easily". I don't see a message about this in her chart.

## 2019-10-19 NOTE — Telephone Encounter (Signed)
OK we may need to let her daughter, tracy know. They have been working with a Child psychotherapist so I was not sure where they were trying to place her.

## 2019-10-31 ENCOUNTER — Telehealth: Payer: Self-pay | Admitting: Physician Assistant

## 2019-10-31 NOTE — Telephone Encounter (Signed)
Patient declined the Medicare Wellness Visit with [facility]  Per French Ana (daughter) she and Boneta Lucks decided she did not need AWVS due to being placed in Long Term Care.

## 2019-11-22 ENCOUNTER — Encounter: Payer: Self-pay | Admitting: Physician Assistant

## 2019-11-26 ENCOUNTER — Other Ambulatory Visit: Payer: Self-pay | Admitting: Physician Assistant

## 2019-11-26 ENCOUNTER — Encounter: Payer: Self-pay | Admitting: Physician Assistant

## 2019-11-26 DIAGNOSIS — E890 Postprocedural hypothyroidism: Secondary | ICD-10-CM

## 2019-11-26 NOTE — Telephone Encounter (Signed)
Patient's daughter sent another message stating FL2 not needed at this time.

## 2019-11-28 ENCOUNTER — Encounter: Payer: Self-pay | Admitting: Physician Assistant

## 2019-12-05 ENCOUNTER — Telehealth: Payer: Self-pay

## 2019-12-05 NOTE — Telephone Encounter (Signed)
Copied from CRM [id number]. Topic: General - Other >> Dec 05, 2019 10:55 AM Leafy Ro wrote: Reason for CRM: pt daughter tracy Kimrey sent my chart message and would like to know if FL2 could be email to her [email or web address]  Noted.

## 2019-12-11 ENCOUNTER — Telehealth: Payer: Self-pay

## 2019-12-11 ENCOUNTER — Other Ambulatory Visit: Payer: Self-pay

## 2019-12-11 ENCOUNTER — Ambulatory Visit
Admission: RE | Admit: 2019-12-11 | Discharge: 2019-12-11 | Disposition: A | Discharge status: Home / Self Care | Payer: Medicare PPO | Source: Ambulatory Visit | Attending: Emergency Medicine | Admitting: Emergency Medicine

## 2019-12-11 VITALS — BP 142/91 | HR 63 | Temp 98.6°F | Resp 18 | Ht 63.0 in | Wt 134.0 lb

## 2019-12-11 DIAGNOSIS — Z111 Encounter for screening for respiratory tuberculosis: Secondary | ICD-10-CM | POA: Diagnosis not present

## 2019-12-11 MED ORDER — TUBERCULIN PPD 5 UNIT/0.1ML ID SOLN
5.0000 [IU] | Freq: Once | INTRADERMAL | Status: DC
  Administered 2019-12-11: 5 [IU] via INTRADERMAL

## 2019-12-11 NOTE — Telephone Encounter (Signed)
Copied from CRM [id number]. Topic: General - Other >> Dec 11, 2019 12:54 PM Leafy Ro wrote: reason for CRM: Larcenia from [facility]  is calling and would like a callback to verify the pt medication. Pt will be moving into facility tomorrow

## 2019-12-11 NOTE — ED Triage Notes (Signed)
Patient states that she is here for a TB Skin test for placement into a nursing facility.

## 2019-12-12 NOTE — Telephone Encounter (Signed)
FYI- Called back and person that answered the phone stated that they have a NP named Heidy " I read the message to her that Earlisha wanted to verify patient medication Person that answered "I don't know anything about it meaning" when I asked what her name was she hung up. I did verified that I was calling [facility].

## 2019-12-13 ENCOUNTER — Ambulatory Visit (INDEPENDENT_AMBULATORY_CARE_PROVIDER_SITE_OTHER): Payer: Medicare PPO | Admitting: Adult Health

## 2019-12-13 ENCOUNTER — Ambulatory Visit: Payer: Self-pay

## 2019-12-13 DIAGNOSIS — F02818 Dementia in other diseases classified elsewhere, unspecified severity, with other behavioral disturbance: Secondary | ICD-10-CM

## 2019-12-13 DIAGNOSIS — F0281 Dementia in other diseases classified elsewhere with behavioral disturbance: Secondary | ICD-10-CM | POA: Diagnosis not present

## 2019-12-13 DIAGNOSIS — F418 Other specified anxiety disorders: Secondary | ICD-10-CM

## 2019-12-13 DIAGNOSIS — G301 Alzheimer's disease with late onset: Secondary | ICD-10-CM | POA: Diagnosis not present

## 2019-12-13 DIAGNOSIS — R410 Disorientation, unspecified: Secondary | ICD-10-CM

## 2019-12-13 MED ORDER — LORAZEPAM 0.5 MG PO TABS: 0.2500 mg | ORAL_TABLET | Freq: Every day | ORAL | 0 refills | Status: DC | PRN

## 2019-12-13 NOTE — Telephone Encounter (Signed)
I spoke with French Ana.  She says [facility] needs something tonight.  She has a phone appointment set up with Marvell Fuller at 4pm today.   Thanks,   -Vernona Rieger

## 2019-12-13 NOTE — Progress Notes (Signed)
Virtual telephone visit    Virtual Visit via Telephone Note   This visit type was conducted due to national recommendations for restrictions regarding the COVID-19 Pandemic (e.g. social distancing) in an effort to limit this patient's exposure and mitigate transmission in our community. Due to her co-morbid illnesses, this patient is at least at moderate risk for complications without adequate follow up. This format is felt to be most appropriate for this patient at this time. The patient did not have access to video technology or had technical difficulties with video requiring transitioning to audio format only (telephone). Physical exam was limited to content and character of the telephone converstion.    Patient location: at home  Provider location: Provider: Provider's office at  [facility], [place].     I discussed the limitations of evaluation and management by telemedicine and the availability of in person appointments. The patient expressed understanding and agreed to proceed.   Visit Date: 12/13/2019  Today's healthcare provider: Jairo Ben, FNP   No chief complaint on file.  Subjective    HPI  Consult Patients daughter Katelyn Baker) is present today for telephone visit, she states that patient was admitted to [facility] today for her memory impairment and states that patient was very agitated when being taking to [facility]. Patients daughter would like to discuss today medication to help patient with symptoms of anxiety.   Verify I am spoke with Katelyn Baker and patient identifiers reviewed and DPR verified.   She reports that mom is having more agitation. Nursing staff asking for something for anxiety until doctor can evaluate at the facility.  Patient has become more agitated  in the past few hours.   She does take Seroquel.  Denies any falls or recent trauma.  Denies any new medical issues.   Patient  denies any fever, body  aches,chills, rash, chest pain, shortness of breath, nausea, vomiting, or diarrhea.  Denies dizziness, lightheadedness, pre syncopal or syncopal episodes.        Medications: Outpatient Medications Prior to Visit  Medication Sig  . Blood Pressure Monitoring (3 SERIES BP MONITOR/WRIST) DEVI To check blood pressure daily (Patient not taking: Reported on 06/29/2019)  . buPROPion (WELLBUTRIN XL) 150 MG 24 hr tablet Take 1 tablet (150 mg total) by mouth daily.  . Calcium Carbonate (CALCIUM 600 PO) Take 600 mg by mouth daily.   Marland Kitchen levothyroxine (SYNTHROID) 100 MCG tablet Take 1 tablet (100 mcg total) by mouth daily.  Marland Kitchen lisinopril (ZESTRIL) 10 MG tablet Take 1 tablet (10 mg total) by mouth daily.  . Memantine HCl-Donepezil HCl (NAMZARIC) 7-10 MG CP24 Take 1 capsule by mouth at bedtime.  . Multiple Vitamin (MULTIVITAMIN) tablet Take 1 tablet by mouth daily.  . QUEtiapine (SEROQUEL) 50 MG tablet Take 1 tablet (50 mg total) by mouth at bedtime.  Marland Kitchen VITAMIN D, ERGOCALCIFEROL, PO Take 2,000 Int'l Units by mouth daily.    No facility-administered medications prior to visit.    Review of Systems  Constitutional: Negative.   HENT: Negative.   Respiratory: Negative.   Cardiovascular: Negative.   Genitourinary: Negative for difficulty urinating, dysuria and urgency.  Neurological: Negative.   Psychiatric/Behavioral: Positive for agitation and confusion. Negative for behavioral problems, decreased concentration, dysphoric mood, hallucinations, self-injury, sleep disturbance and suicidal ideas. The patient is nervous/anxious. The patient is not hyperactive.       Objective    There were no vitals taken for this visit.   Spoke with daughter  on DPR by phone. She is with patient, not in any acute distress.    Assessment & Plan     Late onset Alzheimer's disease with behavioral disturbance (HCC)  Mental confusion  Anxiety about health- nursing home placement.   Situational anxiety  Meds  ordered this encounter  Medications  . LORazepam (ATIVAN) 0.5 MG tablet    Sig: Take 0.5 tablets (0.25 mg total) by mouth daily as needed for anxiety (monitor patient closely not to be used with other hypnotics concurently). Once daily.    Dispense:  10 tablet    Refill:  0   Sent as above, patient is now in facility, she is having situational anxiety. On Seroquel at night. Will order ativan as above, call back if any symptoms worsening and monitor for unwanted side effects discussed with daughter of benzodiazapine's. Not recommended combining with Seroquel.   Follow up call with [name], PA-C advised next week or provider. Facility MD is coming to evaluate so this should be short - term.   Strict call back precautions as well as emergent precaution.  Return in about 1 week (around 12/20/2019), or if symptoms worsen or fail to improve, for at any time for any worsening symptoms, Go to Emergency room/ urgent care if worse.    I discussed the assessment and treatment plan with the patient. The patient was provided an opportunity to ask questions and all were answered. The patient agreed with the plan and demonstrated an understanding of the instructions.   The patient was advised to call back or seek an in-person evaluation if the symptoms worsen or if the condition fails to improve as anticipated.  I provided 20  minutes of non-face-to-face time during this encounter.  I discussed the limitations of evaluation and management by telemedicine and the availability of in person appointments. The patient/ patient contact on DPR daughter  expressed understanding and agreed to proceed.  Jairo Ben, FNP [facility] [phone] (phone) [phone] (fax)  [facility]

## 2019-12-13 NOTE — Telephone Encounter (Signed)
We would need to do virtual or something to assess patient unless PCP has prior knowledge fo this and wants to send something in.  Will forward to PCP also.

## 2019-12-13 NOTE — Telephone Encounter (Signed)
Patients daughter called stating that they have just taken her mother to [facility] today.  She states that the facility is requesting medication for agitation and anxiety for her mother. Daughter would like medication sent to [facility] in [place]. Family will pick it up and deliver it to facility. Please advise Saphyra Hutt (daughter) [phone]  Answer Assessment - Initial Assessment Questions 1. REASON FOR CALL or QUESTION: "What is your reason for calling today?" or "How can I best help you?" or "What question do you have that I can help answer?"     Patient has just entered [facility]. They are requesting anxiety aggitation medication for patient. Daughterwill pick up and take to [facility]. 2. CALLER: Document the source of call. (e.g., laboratory, patient).     *No Answer*  Protocols used: PCP CALL - NO TRIAGE-A-AH

## 2019-12-13 NOTE — Telephone Encounter (Signed)
Can we see if French Ana can do a phone call with me tomorrow please?

## 2019-12-14 ENCOUNTER — Encounter: Payer: Self-pay | Admitting: Adult Health

## 2019-12-14 DIAGNOSIS — F418 Other specified anxiety disorders: Secondary | ICD-10-CM | POA: Insufficient documentation

## 2019-12-14 DIAGNOSIS — R410 Disorientation, unspecified: Secondary | ICD-10-CM | POA: Insufficient documentation

## 2020-01-19 ENCOUNTER — Emergency Department: Payer: Medicare PPO

## 2020-01-19 ENCOUNTER — Encounter: Payer: Self-pay | Admitting: Emergency Medicine

## 2020-01-19 ENCOUNTER — Other Ambulatory Visit: Payer: Self-pay

## 2020-01-19 ENCOUNTER — Emergency Department
Admission: EM | Admit: 2020-01-19 | Discharge: 2020-01-19 | Disposition: A | Discharge status: Home / Self Care | Payer: Medicare PPO | Attending: Emergency Medicine | Admitting: Emergency Medicine

## 2020-01-19 DIAGNOSIS — W1839XA Other fall on same level, initial encounter: Secondary | ICD-10-CM | POA: Insufficient documentation

## 2020-01-19 DIAGNOSIS — Y92009 Unspecified place in unspecified non-institutional (private) residence as the place of occurrence of the external cause: Secondary | ICD-10-CM | POA: Diagnosis not present

## 2020-01-19 DIAGNOSIS — W19XXXA Unspecified fall, initial encounter: Secondary | ICD-10-CM

## 2020-01-19 DIAGNOSIS — G301 Alzheimer's disease with late onset: Secondary | ICD-10-CM | POA: Diagnosis not present

## 2020-01-19 DIAGNOSIS — S0081XA Abrasion of other part of head, initial encounter: Secondary | ICD-10-CM | POA: Diagnosis present

## 2020-01-19 DIAGNOSIS — Z7722 Contact with and (suspected) exposure to environmental tobacco smoke (acute) (chronic): Secondary | ICD-10-CM | POA: Diagnosis not present

## 2020-01-19 DIAGNOSIS — E236 Other disorders of pituitary gland: Secondary | ICD-10-CM | POA: Diagnosis not present

## 2020-01-19 DIAGNOSIS — S0083XA Contusion of other part of head, initial encounter: Secondary | ICD-10-CM | POA: Insufficient documentation

## 2020-01-19 DIAGNOSIS — E039 Hypothyroidism, unspecified: Secondary | ICD-10-CM | POA: Diagnosis not present

## 2020-01-19 DIAGNOSIS — I10 Essential (primary) hypertension: Secondary | ICD-10-CM | POA: Insufficient documentation

## 2020-01-19 DIAGNOSIS — Z79899 Other long term (current) drug therapy: Secondary | ICD-10-CM | POA: Insufficient documentation

## 2020-01-19 LAB — CBC WITH DIFFERENTIAL/PLATELET
Abs Immature Granulocytes: 0.01 10*3/uL (ref 0.00–0.07)
Basophils Absolute: 0 10*3/uL (ref 0.0–0.1)
Basophils Relative: 0 %
Eosinophils Absolute: 0 10*3/uL (ref 0.0–0.5)
Eosinophils Relative: 1 %
HCT: 38.8 % (ref 36.0–46.0)
Hemoglobin: 13.1 g/dL (ref 12.0–15.0)
Immature Granulocytes: 0 %
Lymphocytes Relative: 14 %
Lymphs Abs: 0.8 10*3/uL (ref 0.7–4.0)
MCH: 31.5 pg (ref 26.0–34.0)
MCHC: 33.8 g/dL (ref 30.0–36.0)
MCV: 93.3 fL (ref 80.0–100.0)
Monocytes Absolute: 0.6 10*3/uL (ref 0.1–1.0)
Monocytes Relative: 9 %
Neutro Abs: 4.8 10*3/uL (ref 1.7–7.7)
Neutrophils Relative %: 76 %
Platelets: 157 10*3/uL (ref 150–400)
RBC: 4.16 MIL/uL (ref 3.87–5.11)
RDW: 12.9 % (ref 11.5–15.5)
WBC: 6.2 10*3/uL (ref 4.0–10.5)
nRBC: 0 % (ref 0.0–0.2)

## 2020-01-19 LAB — BASIC METABOLIC PANEL
Anion gap: 11 (ref 5–15)
BUN: 11 mg/dL (ref 8–23)
CO2: 26 mmol/L (ref 22–32)
Calcium: 9.4 mg/dL (ref 8.9–10.3)
Chloride: 103 mmol/L (ref 98–111)
Creatinine, Ser: 0.87 mg/dL (ref 0.44–1.00)
GFR, Estimated: >60 mL/min (ref >60)
Glucose, Bld: 96 mg/dL (ref 70–99)
Potassium: 3.9 mmol/L (ref 3.5–5.1)
Sodium: 140 mmol/L (ref 135–145)

## 2020-01-19 LAB — URINALYSIS, COMPLETE (UACMP) WITH MICROSCOPIC
Bilirubin Urine: NEGATIVE
Glucose, UA: NEGATIVE mg/dL
Hgb urine dipstick: NEGATIVE
Ketones, ur: NEGATIVE mg/dL
Leukocytes,Ua: NEGATIVE
Nitrite: NEGATIVE
Protein, ur: NEGATIVE mg/dL
Specific Gravity, Urine: 1.004 — ABNORMAL LOW (ref 1.005–1.030)
Squamous Epithelial / HPF: NONE SEEN (ref 0–5)
pH: 9 — ABNORMAL HIGH (ref 5.0–8.0)

## 2020-01-19 NOTE — ED Provider Notes (Signed)
[facility] Emergency Department Provider Note ____________________________________________  Time seen: 1633  I have reviewed the triage vital signs and the nursing notes.  HISTORY  Chief Complaint  Facial Injury  History is limited secondary to the patient's dementia.  HPI Katelyn Baker is a 74 y.o. female presents to the ED via EMS from [facility].  Patient with a history of dementia presents after a reported fall.  She presents with an abrasion to the cheek.  There was no reported loss of consciousness.   Past Medical History:  Diagnosis Date  . GERD (gastroesophageal reflux disease)   . Hyperlipidemia   . Thyroid disease     Patient Active Problem List   Diagnosis Date Noted  . Anxiety about health 12/14/2019  . Mental confusion 12/14/2019  . Essential hypertension 04/07/2018  . Benign essential tremor 09/30/2017  . Late onset Alzheimer's disease with behavioral disturbance (HCC) 04/29/2017  . Abnormal weight loss 04/29/2015  . Urge incontinence of urine 12/17/2014  . Hypercholesteremia 08/19/2014  . History of colon polyps 08/19/2014  . 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 Avitaminosis D 10/02/2009  . Abnormal blood sugar 06/24/2007  . Abnormal LFTs 06/23/2007  . Acid reflux 12/16/2005  . Bowel disease 12/16/2005  . 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 ABDOMINAL HYSTERECTOMY     partial  . bladder tact    . BREAST BIOPSY Left 1980'S   EXCISIONAL - NEG  . BREAST SURGERY  2000's   biopsy  . THYROIDECTOMY, PARTIAL  1990's    Prior to Admission medications   Medication Sig Start Date End Date Taking? Authorizing Provider  Blood Pressure Monitoring (3 SERIES BP MONITOR/WRIST) DEVI To check blood pressure daily Patient not taking: Reported on 06/29/2019 04/26/19   [name], PA-C  buPROPion (WELLBUTRIN XL) 150 MG 24 hr tablet Take 1 tablet (150 mg total)  by mouth daily. 04/16/19   [name], PA-C  Calcium Carbonate (CALCIUM 600 PO) Take 600 mg by mouth daily.     [provider]  levothyroxine (SYNTHROID) 100 MCG tablet Take 1 tablet (100 mcg total) by mouth daily. 04/16/19   [name], PA-C  lisinopril (ZESTRIL) 10 MG tablet Take 1 tablet (10 mg total) by mouth daily. 04/16/19   [name], PA-C  LORazepam (ATIVAN) 0.5 MG tablet Take 0.5 tablets (0.25 mg total) by mouth daily as needed for anxiety (monitor patient closely not to be used with other hypnotics concurently). Once daily. 12/13/19   Flinchum, Eula Fried, FNP  Memantine HCl-Donepezil HCl (NAMZARIC) 7-10 MG CP24 Take 1 capsule by mouth at bedtime. 10/05/19   [name], PA-C  Multiple Vitamin (MULTIVITAMIN) tablet Take 1 tablet by mouth daily.    [provider]  QUEtiapine (SEROQUEL) 50 MG tablet Take 1 tablet (50 mg total) by mouth at bedtime. 10/05/19   [name], PA-C  VITAMIN D, ERGOCALCIFEROL, PO Take 2,000 Int'l Units by mouth daily.     [provider]  levothyroxine (SYNTHROID, LEVOTHROID) 100 MCG tablet TAKE 1 TABLET BY MOUTH ONCE DAILY 03/22/18   [name], PA-C    Allergies Bactrim [sulfamethoxazole-trimethoprim]  Family History  Problem Relation Age of Onset  . Alzheimer's disease Mother   . Diabetes Mother        ?  Marland Kitchen Alcohol abuse Father   . Cancer Father        unknown cancer  .  Diabetes Other   . Heart disease Other   . Cancer Other   . Colon polyps Other   . Breast cancer Neg Hx     Social History Social History   Tobacco Use  . Smoking status: Passive Smoke Exposure - Never Smoker  . Smokeless tobacco: Never Used  Vaping Use  . Vaping Use: Never used  Substance Use Topics  . Alcohol use: No  . Drug use: No    Review of Systems  Constitutional: Negative for fever. Eyes: Negative for visual changes. ENT: Negative for sore throat. Cardiovascular: Negative for  chest pain. Respiratory: Negative for shortness of breath. Gastrointestinal: Negative for abdominal pain, vomiting and diarrhea. Genitourinary: Negative for dysuria. Musculoskeletal: Negative for back pain. Skin: Negative for rash.  Facial abrasion as above.   Neurological: Negative for headaches, focal weakness or numbness. ____________________________________________  PHYSICAL EXAM:  VITAL SIGNS: ED Triage Vitals  Enc Vitals Group     BP 01/19/20 1617 (!) 144/80     Pulse Rate 01/19/20 1617 75     Resp 01/19/20 1617 18     Temp 01/19/20 1617 98.2 F (36.8 C)     Temp Source 01/19/20 1617 Oral     SpO2 01/19/20 1617 100 %     Weight 01/19/20 1624 140 lb (63.5 kg)     Height 01/19/20 1624 5\' 3"  (1.6 m)     Head Circumference --      Peak Flow --      Pain Score --      Pain Loc --      Pain Edu? --      Excl. in GC? --     Constitutional: Alert and oriented. Well appearing and in no distress. Head: Normocephalic and atraumatic, except for superficial abrasion to the left zygomatic cheek. Eyes: Conjunctivae are normal. PERRL. Normal extraocular movements Cardiovascular: Normal rate, regular rhythm. Normal distal pulses. Respiratory: Normal respiratory effort. No wheezes/rales/rhonchi. Gastrointestinal: Soft and nontender. No distention. Musculoskeletal: Nontender with normal range of motion in all extremities.  Neurologic:. Normal speech and language. No gross focal neurologic deficits are appreciated. Skin:  Skin is warm, dry and intact. No rash noted. Psychiatric: Mood and affect are normal. Patient exhibits appropriate insight and judgment. ____________________________________________   RADIOLOGY  CT Head / Maxillofacial w/o CM  IMPRESSION: 1. 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 The mass measures approximately 1.6 x 2.5 x 2.1 cm and appears to be heterogeneous with associated calcifications. Recommend  MRI pituitary gland for further evaluation. 2. Otherwise no acute intracranial abnormality. 3. No acute displaced facial fracture.  These results were called by telephone at the time of interpretation on 01/19/2020 at 5:35 pm to provider PA Shubh Chiara , who verbally acknowledged these results.  DG Right Shoulder  IMPRESSION: Negative.  ____________________________________________  LABORATORY  Labs Reviewed  URINALYSIS, COMPLETE (UACMP) WITH MICROSCOPIC - Abnormal; Notable for the following components:      Result Value   Color, Urine STRAW (*)    APPearance CLEAR (*)    Specific Gravity, Urine 1.004 (*)    pH 9.0 (*)    Bacteria, UA RARE (*)    All other components within normal limits  BASIC METABOLIC PANEL  CBC WITH DIFFERENTIAL/PLATELET    Procedures ____________________________________________  INITIAL IMPRESSION / ASSESSMENT AND PLAN / ED COURSE  ----------------------------------------- 6:56 PM on 01/19/2020 ----------------------------------------- S/W 01/21/2020 (daugther): she is aware of incidental CT head findings. She  will also pick up and transport the patient back to her facility. She will work to arrange outpatient MRI as recommended.   Pleasantly confused geriatric patient, secondary to Alzheimer's disease with ED evaluation of injury sustained following a fall.  Patient was evaluated for facial abrasion with a head and face CT which were otherwise unremarkable for any acute findings. 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  I reviewed the case with my attending Virginia Crews). We concluded that the MRI could be performed on a non-emergent basis, given the patient's stable baseline. Discussed this with the patient's daughter, and she will work to ensure that the outpatient study is performed.  Patient's labs otherwise reassuring and no signs of an acute UTI on labs.  She will follow-up with  the primary provider return to the ED if needed.  Katelyn Baker was evaluated in Emergency Department on 01/19/2020 for the symptoms described in the history of present illness. She was evaluated in the context of the global COVID-19 pandemic, which necessitated consideration that the patient might be at risk for infection with the SARS-CoV-2 virus that causes COVID-19. Institutional protocols and algorithms that pertain to the evaluation of patients at risk for COVID-19 are in a state of rapid change based on information released by regulatory bodies including the CDC and federal and state organizations. These policies and algorithms were followed during the patient's care in the ED. 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

## 2020-01-19 NOTE — ED Notes (Signed)
Pt on bed pan again.

## 2020-01-19 NOTE — ED Notes (Signed)
Pt stated she needed to urinate. Placed on bedpan. Cleaned with wipes. Pt had dry stool to bottom. Cleaned by this RN.

## 2020-01-19 NOTE — ED Notes (Signed)
DC instructions discussed with daughter, advised her patient needs follow up for MRI.  Verbalized understanding and would follow up with [facility].

## 2020-01-19 NOTE — ED Notes (Signed)
Removed Pt's IV from right forearm. Dressed Pt in her clothes and put a brief on Pt so she would be ready to go when her daughter came to pick her up.

## 2020-01-19 NOTE — Discharge Instructions (Addendum)
You have been evaluated for injuries related to a fall.  You have a normal/negative head CT and face CT related to anything from the fall.  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  This will need to be further evaluated by an outpatient MRI.  Please follow-up with your primary provider for further management of this incidental finding.  Turn to the ED if needed.

## 2020-01-19 NOTE — ED Triage Notes (Signed)
First RN Note: Pt presents to ED via POV with c/o fall with abrasion to L cheek. Per EMS pt with hx of dementia. Per EMS pt from [facility].

## 2020-01-19 NOTE — ED Notes (Signed)
Xray brought pt back and did NOT turn back on bed alarm. Pt was found walking in hallway stating she needed to use restroom. Morrie Sheldon PA assisted pt to restroom and then back to room. This RN helped pt back into bed and turned bed alarm ON. Pt instructed to use call bell if she needs to use the restroom again.

## 2020-01-19 NOTE — ED Notes (Signed)
High Fall Risk band placed on L wrist

## 2020-01-19 NOTE — ED Notes (Signed)
MRI is going to call pt daughter for MRI screening. Family is aware that pt is here.

## 2020-01-19 NOTE — ED Triage Notes (Signed)
Pt arrived via [facility] from [facility], per EMS, pt had a fall and has abrasion to left upper cheek.  Pt has hx of dementia and is poor historian. Pt is alert to self only at this time.

## 2020-01-19 NOTE — ED Notes (Signed)
Pt taken to scans  

## 2020-01-30 DIAGNOSIS — G309 Alzheimer's disease, unspecified: Secondary | ICD-10-CM | POA: Diagnosis not present

## 2020-01-30 DIAGNOSIS — E559 Vitamin D deficiency, unspecified: Secondary | ICD-10-CM | POA: Diagnosis not present

## 2020-01-30 DIAGNOSIS — K219 Gastro-esophageal reflux disease without esophagitis: Secondary | ICD-10-CM | POA: Diagnosis not present

## 2020-01-30 DIAGNOSIS — E039 Hypothyroidism, unspecified: Secondary | ICD-10-CM | POA: Diagnosis not present

## 2020-02-12 DIAGNOSIS — G309 Alzheimer's disease, unspecified: Secondary | ICD-10-CM | POA: Diagnosis not present

## 2020-02-12 DIAGNOSIS — I10 Essential (primary) hypertension: Secondary | ICD-10-CM | POA: Diagnosis not present

## 2020-02-12 DIAGNOSIS — K219 Gastro-esophageal reflux disease without esophagitis: Secondary | ICD-10-CM | POA: Diagnosis not present

## 2020-02-12 DIAGNOSIS — M1990 Unspecified osteoarthritis, unspecified site: Secondary | ICD-10-CM | POA: Diagnosis not present

## 2020-02-12 DIAGNOSIS — F0281 Dementia in other diseases classified elsewhere with behavioral disturbance: Secondary | ICD-10-CM | POA: Diagnosis not present

## 2020-02-12 DIAGNOSIS — Z79899 Other long term (current) drug therapy: Secondary | ICD-10-CM | POA: Diagnosis not present

## 2020-02-19 DIAGNOSIS — G309 Alzheimer's disease, unspecified: Secondary | ICD-10-CM | POA: Diagnosis not present

## 2020-02-19 DIAGNOSIS — E237 Disorder of pituitary gland, unspecified: Secondary | ICD-10-CM | POA: Diagnosis not present

## 2020-02-19 DIAGNOSIS — K219 Gastro-esophageal reflux disease without esophagitis: Secondary | ICD-10-CM | POA: Diagnosis not present

## 2020-02-19 DIAGNOSIS — Z79899 Other long term (current) drug therapy: Secondary | ICD-10-CM | POA: Diagnosis not present

## 2020-02-19 DIAGNOSIS — Z9181 History of falling: Secondary | ICD-10-CM | POA: Diagnosis not present

## 2020-03-04 DIAGNOSIS — Z03818 Encounter for observation for suspected exposure to other biological agents ruled out: Secondary | ICD-10-CM | POA: Diagnosis not present

## 2020-03-18 DIAGNOSIS — R269 Unspecified abnormalities of gait and mobility: Secondary | ICD-10-CM | POA: Diagnosis not present

## 2020-03-18 DIAGNOSIS — Z9181 History of falling: Secondary | ICD-10-CM | POA: Diagnosis not present

## 2020-03-18 DIAGNOSIS — G309 Alzheimer's disease, unspecified: Secondary | ICD-10-CM | POA: Diagnosis not present

## 2020-03-18 DIAGNOSIS — Z79899 Other long term (current) drug therapy: Secondary | ICD-10-CM | POA: Diagnosis not present

## 2020-03-18 DIAGNOSIS — U071 COVID-19: Secondary | ICD-10-CM | POA: Diagnosis not present

## 2020-03-18 DIAGNOSIS — S0511XA Contusion of eyeball and orbital tissues, right eye, initial encounter: Secondary | ICD-10-CM | POA: Diagnosis not present

## 2020-03-18 DIAGNOSIS — I10 Essential (primary) hypertension: Secondary | ICD-10-CM | POA: Diagnosis not present

## 2020-03-25 DIAGNOSIS — I10 Essential (primary) hypertension: Secondary | ICD-10-CM | POA: Diagnosis not present

## 2020-03-25 DIAGNOSIS — G4719 Other hypersomnia: Secondary | ICD-10-CM | POA: Diagnosis not present

## 2020-03-25 DIAGNOSIS — Z79899 Other long term (current) drug therapy: Secondary | ICD-10-CM | POA: Diagnosis not present

## 2020-03-25 DIAGNOSIS — G309 Alzheimer's disease, unspecified: Secondary | ICD-10-CM | POA: Diagnosis not present

## 2020-03-25 DIAGNOSIS — F0281 Dementia in other diseases classified elsewhere with behavioral disturbance: Secondary | ICD-10-CM | POA: Diagnosis not present

## 2020-03-25 DIAGNOSIS — E039 Hypothyroidism, unspecified: Secondary | ICD-10-CM | POA: Diagnosis not present

## 2020-04-01 DIAGNOSIS — F0281 Dementia in other diseases classified elsewhere with behavioral disturbance: Secondary | ICD-10-CM | POA: Diagnosis not present

## 2020-04-01 DIAGNOSIS — G309 Alzheimer's disease, unspecified: Secondary | ICD-10-CM | POA: Diagnosis not present

## 2020-04-01 DIAGNOSIS — Z79899 Other long term (current) drug therapy: Secondary | ICD-10-CM | POA: Diagnosis not present

## 2020-04-01 DIAGNOSIS — R400 Somnolence: Secondary | ICD-10-CM | POA: Diagnosis not present

## 2020-04-01 DIAGNOSIS — I10 Essential (primary) hypertension: Secondary | ICD-10-CM | POA: Diagnosis not present

## 2020-04-01 DIAGNOSIS — K219 Gastro-esophageal reflux disease without esophagitis: Secondary | ICD-10-CM | POA: Diagnosis not present

## 2020-04-08 DIAGNOSIS — R269 Unspecified abnormalities of gait and mobility: Secondary | ICD-10-CM | POA: Diagnosis not present

## 2020-04-08 DIAGNOSIS — G309 Alzheimer's disease, unspecified: Secondary | ICD-10-CM | POA: Diagnosis not present

## 2020-04-08 DIAGNOSIS — Z79899 Other long term (current) drug therapy: Secondary | ICD-10-CM | POA: Diagnosis not present

## 2020-04-08 DIAGNOSIS — I10 Essential (primary) hypertension: Secondary | ICD-10-CM | POA: Diagnosis not present

## 2020-04-08 DIAGNOSIS — M1990 Unspecified osteoarthritis, unspecified site: Secondary | ICD-10-CM | POA: Diagnosis not present

## 2020-04-08 DIAGNOSIS — F0281 Dementia in other diseases classified elsewhere with behavioral disturbance: Secondary | ICD-10-CM | POA: Diagnosis not present

## 2020-04-28 DIAGNOSIS — F0281 Dementia in other diseases classified elsewhere with behavioral disturbance: Secondary | ICD-10-CM | POA: Diagnosis not present

## 2020-04-28 DIAGNOSIS — E559 Vitamin D deficiency, unspecified: Secondary | ICD-10-CM | POA: Diagnosis not present

## 2020-04-28 DIAGNOSIS — K219 Gastro-esophageal reflux disease without esophagitis: Secondary | ICD-10-CM | POA: Diagnosis not present

## 2020-04-28 DIAGNOSIS — G309 Alzheimer's disease, unspecified: Secondary | ICD-10-CM | POA: Diagnosis not present

## 2020-04-28 DIAGNOSIS — E039 Hypothyroidism, unspecified: Secondary | ICD-10-CM | POA: Diagnosis not present

## 2020-05-05 DIAGNOSIS — K219 Gastro-esophageal reflux disease without esophagitis: Secondary | ICD-10-CM | POA: Diagnosis not present

## 2020-05-05 DIAGNOSIS — E039 Hypothyroidism, unspecified: Secondary | ICD-10-CM | POA: Diagnosis not present

## 2020-05-05 DIAGNOSIS — E559 Vitamin D deficiency, unspecified: Secondary | ICD-10-CM | POA: Diagnosis not present

## 2020-05-05 DIAGNOSIS — G309 Alzheimer's disease, unspecified: Secondary | ICD-10-CM | POA: Diagnosis not present

## 2020-05-05 DIAGNOSIS — F0281 Dementia in other diseases classified elsewhere with behavioral disturbance: Secondary | ICD-10-CM | POA: Diagnosis not present

## 2020-05-29 DIAGNOSIS — G309 Alzheimer's disease, unspecified: Secondary | ICD-10-CM | POA: Diagnosis not present

## 2020-05-29 DIAGNOSIS — K219 Gastro-esophageal reflux disease without esophagitis: Secondary | ICD-10-CM | POA: Diagnosis not present

## 2020-05-29 DIAGNOSIS — E039 Hypothyroidism, unspecified: Secondary | ICD-10-CM | POA: Diagnosis not present

## 2020-05-29 DIAGNOSIS — E559 Vitamin D deficiency, unspecified: Secondary | ICD-10-CM | POA: Diagnosis not present

## 2020-05-29 DIAGNOSIS — F0281 Dementia in other diseases classified elsewhere with behavioral disturbance: Secondary | ICD-10-CM | POA: Diagnosis not present

## 2020-06-05 DIAGNOSIS — K219 Gastro-esophageal reflux disease without esophagitis: Secondary | ICD-10-CM | POA: Diagnosis not present

## 2020-06-05 DIAGNOSIS — Z79899 Other long term (current) drug therapy: Secondary | ICD-10-CM | POA: Diagnosis not present

## 2020-06-05 DIAGNOSIS — G309 Alzheimer's disease, unspecified: Secondary | ICD-10-CM | POA: Diagnosis not present

## 2020-06-05 DIAGNOSIS — E237 Disorder of pituitary gland, unspecified: Secondary | ICD-10-CM | POA: Diagnosis not present

## 2020-06-05 DIAGNOSIS — F0281 Dementia in other diseases classified elsewhere with behavioral disturbance: Secondary | ICD-10-CM | POA: Diagnosis not present

## 2020-06-05 DIAGNOSIS — Z9181 History of falling: Secondary | ICD-10-CM | POA: Diagnosis not present

## 2020-06-12 DIAGNOSIS — Z79899 Other long term (current) drug therapy: Secondary | ICD-10-CM | POA: Diagnosis not present

## 2020-06-12 DIAGNOSIS — G309 Alzheimer's disease, unspecified: Secondary | ICD-10-CM | POA: Diagnosis not present

## 2020-06-12 DIAGNOSIS — K219 Gastro-esophageal reflux disease without esophagitis: Secondary | ICD-10-CM | POA: Diagnosis not present

## 2020-06-12 DIAGNOSIS — I10 Essential (primary) hypertension: Secondary | ICD-10-CM | POA: Diagnosis not present

## 2020-06-12 DIAGNOSIS — F0281 Dementia in other diseases classified elsewhere with behavioral disturbance: Secondary | ICD-10-CM | POA: Diagnosis not present

## 2020-06-20 ENCOUNTER — Inpatient Hospital Stay
Admission: EM | Admit: 2020-06-20 | Discharge: 2020-06-30 | DRG: 871 | Disposition: A | Discharge status: Hospice | Payer: Medicare PPO | Attending: Internal Medicine | Admitting: Internal Medicine

## 2020-06-20 DIAGNOSIS — Z820 Family history of epilepsy and other diseases of the nervous system: Secondary | ICD-10-CM | POA: Diagnosis not present

## 2020-06-20 DIAGNOSIS — G301 Alzheimer's disease with late onset: Secondary | ICD-10-CM | POA: Diagnosis present

## 2020-06-20 DIAGNOSIS — K668 Other specified disorders of peritoneum: Secondary | ICD-10-CM | POA: Diagnosis present

## 2020-06-20 DIAGNOSIS — K631 Perforation of intestine (nontraumatic): Secondary | ICD-10-CM | POA: Diagnosis present

## 2020-06-20 DIAGNOSIS — Z882 Allergy status to sulfonamides status: Secondary | ICD-10-CM | POA: Diagnosis not present

## 2020-06-20 DIAGNOSIS — K6389 Other specified diseases of intestine: Secondary | ICD-10-CM | POA: Diagnosis present

## 2020-06-20 DIAGNOSIS — Z86010 Personal history of colonic polyps: Secondary | ICD-10-CM | POA: Diagnosis not present

## 2020-06-20 DIAGNOSIS — K625 Hemorrhage of anus and rectum: Secondary | ICD-10-CM | POA: Diagnosis present

## 2020-06-20 DIAGNOSIS — R6520 Severe sepsis without septic shock: Secondary | ICD-10-CM | POA: Diagnosis present

## 2020-06-20 DIAGNOSIS — K55029 Acute infarction of small intestine, extent unspecified: Secondary | ICD-10-CM | POA: Diagnosis present

## 2020-06-20 DIAGNOSIS — F02818 Dementia in other diseases classified elsewhere, unspecified severity, with other behavioral disturbance: Secondary | ICD-10-CM | POA: Diagnosis present

## 2020-06-20 DIAGNOSIS — F0281 Dementia in other diseases classified elsewhere with behavioral disturbance: Secondary | ICD-10-CM | POA: Diagnosis present

## 2020-06-20 DIAGNOSIS — A419 Sepsis, unspecified organism: Secondary | ICD-10-CM

## 2020-06-20 DIAGNOSIS — Z515 Encounter for palliative care: Secondary | ICD-10-CM | POA: Diagnosis not present

## 2020-06-20 DIAGNOSIS — R58 Hemorrhage, not elsewhere classified: Secondary | ICD-10-CM | POA: Diagnosis not present

## 2020-06-20 DIAGNOSIS — E890 Postprocedural hypothyroidism: Secondary | ICD-10-CM | POA: Diagnosis present

## 2020-06-20 DIAGNOSIS — E785 Hyperlipidemia, unspecified: Secondary | ICD-10-CM | POA: Diagnosis present

## 2020-06-20 DIAGNOSIS — K219 Gastro-esophageal reflux disease without esophagitis: Secondary | ICD-10-CM | POA: Diagnosis present

## 2020-06-20 DIAGNOSIS — N2889 Other specified disorders of kidney and ureter: Secondary | ICD-10-CM | POA: Diagnosis present

## 2020-06-20 DIAGNOSIS — Z79899 Other long term (current) drug therapy: Secondary | ICD-10-CM

## 2020-06-20 DIAGNOSIS — Z66 Do not resuscitate: Secondary | ICD-10-CM | POA: Diagnosis present

## 2020-06-20 DIAGNOSIS — Z20822 Contact with and (suspected) exposure to covid-19: Secondary | ICD-10-CM | POA: Diagnosis present

## 2020-06-20 DIAGNOSIS — K55019 Acute (reversible) ischemia of small intestine, extent unspecified: Secondary | ICD-10-CM | POA: Diagnosis not present

## 2020-06-20 DIAGNOSIS — I10 Essential (primary) hypertension: Secondary | ICD-10-CM | POA: Diagnosis present

## 2020-06-20 DIAGNOSIS — K449 Diaphragmatic hernia without obstruction or gangrene: Secondary | ICD-10-CM | POA: Diagnosis present

## 2020-06-20 DIAGNOSIS — E7800 Pure hypercholesterolemia, unspecified: Secondary | ICD-10-CM | POA: Diagnosis present

## 2020-06-20 DIAGNOSIS — Z79890 Hormone replacement therapy (postmenopausal): Secondary | ICD-10-CM

## 2020-06-20 DIAGNOSIS — Z90710 Acquired absence of both cervix and uterus: Secondary | ICD-10-CM

## 2020-06-20 DIAGNOSIS — K922 Gastrointestinal hemorrhage, unspecified: Secondary | ICD-10-CM | POA: Diagnosis not present

## 2020-06-20 DIAGNOSIS — R140 Abdominal distension (gaseous): Secondary | ICD-10-CM | POA: Diagnosis not present

## 2020-06-20 DIAGNOSIS — E039 Hypothyroidism, unspecified: Secondary | ICD-10-CM | POA: Diagnosis present

## 2020-06-20 DIAGNOSIS — I959 Hypotension, unspecified: Secondary | ICD-10-CM | POA: Diagnosis not present

## 2020-06-20 MED ORDER — SODIUM CHLORIDE 0.9 % IV SOLN
80.0000 mg | Freq: Once | INTRAVENOUS | Status: AC
  Administered 2020-06-21: 80 mg via INTRAVENOUS
  Filled 2020-06-20: qty 80

## 2020-06-20 MED ORDER — SODIUM CHLORIDE 0.9 % IV BOLUS: 500.0000 mL | Freq: Once | INTRAVENOUS | Status: DC

## 2020-06-20 MED ORDER — TRANEXAMIC ACID-NACL 1000-0.7 MG/100ML-% IV SOLN
1000.0000 mg | INTRAVENOUS | Status: AC
  Administered 2020-06-21: 1000 mg via INTRAVENOUS
  Filled 2020-06-20: qty 100

## 2020-06-20 MED ORDER — SODIUM CHLORIDE 0.9 % IV SOLN
8.0000 mg/h | INTRAVENOUS | Status: DC
  Administered 2020-06-21: 8 mg/h via INTRAVENOUS
  Filled 2020-06-20: qty 80

## 2020-06-20 NOTE — ED Triage Notes (Signed)
EMS brought in from [facility] (advanced dementia unit) with chief complaint of rectal bleeding. Staff from facility reports patient diaper had bright red blood with large clots this evening. Patient confused to baseline. NAD.

## 2020-06-20 NOTE — ED Provider Notes (Signed)
[facility] Emergency Department Provider Note  ____________________________________________   Event Date/Time   First MD Initiated Contact with Patient 06/20/20 2324     (approximate)  I have reviewed the triage vital signs and the nursing notes.   HISTORY  Chief Complaint GI Bleeding  Level 5 caveat:  history/ROS limited by chronic dementia  HPI Katelyn Mediciancy J Baker is a 75 y.o. female who presents by EMS for evaluation of acute lower GI bleeding.  Not many details are available due to the patient's advanced dementia.  [facility] and when they went to change her adult undergarment they found that she had a large amount of blood in her undergarment as well as in the bathroom, in the toilet, on the floor, etc.  It appears to be bright red as well as having some clots.  The patient reports pain in her abdomen and she is holding very still and holding her abdomen.  She says she is uncomfortable and has not been feeling well.  She is not able to provide any additional details.         Past Medical History:  Diagnosis Date  . GERD (gastroesophageal reflux disease)   . Hyperlipidemia   . Thyroid disease     Patient Active Problem List   Diagnosis Date Noted  . Rectal bleeding 06/21/2020  . Anxiety about health 12/14/2019  . Mental confusion 12/14/2019  . Essential hypertension 04/07/2018  . Benign essential tremor 09/30/2017  . Late onset Alzheimer's disease with behavioral disturbance (HCC) 04/29/2017  . Abnormal weight loss 04/29/2015  . Urge incontinence of urine 12/17/2014  . Hypercholesteremia 08/19/2014  . History of colon polyps 08/19/2014  . 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 Avitaminosis D 10/02/2009  . Abnormal blood sugar 06/24/2007  . Abnormal LFTs 06/23/2007  . Acid reflux 12/16/2005  . Bowel disease 12/16/2005  . Hypothyroidism, postop 12/16/2005    Past Surgical History:  Procedure Laterality  Date  . ABDOMINAL HYSTERECTOMY     partial  . bladder tact    . BREAST BIOPSY Left 1980'S   EXCISIONAL - NEG  . BREAST SURGERY  2000's   biopsy  . THYROIDECTOMY, PARTIAL  1990's    Prior to Admission medications   Medication Sig Start Date End Date Taking? Authorizing Provider  buPROPion (WELLBUTRIN XL) 150 MG 24 hr tablet Take 1 tablet (150 mg total) by mouth daily. 04/16/19  Yes Margaretann LovelessBurnette, Jennifer M, PA-C  Calcium Carbonate (CALCIUM 600 PO) Take 600 mg by mouth daily.    Yes [provider]  levothyroxine (SYNTHROID) 112 MCG tablet Take 112 mcg by mouth daily before breakfast.   Yes [provider]  lisinopril (ZESTRIL) 10 MG tablet Take 1 tablet (10 mg total) by mouth daily. 04/16/19  Yes Margaretann LovelessBurnette, Jennifer M, PA-C  LORazepam (ATIVAN) 0.5 MG tablet Take 0.5 mg by mouth 2 (two) times daily as needed for sedation.   Yes [provider]  Multiple Vitamin (MULTIVITAMIN) tablet Take 1 tablet by mouth daily.   Yes [provider]  Multiple Vitamins-Minerals (MULTIVITAMIN WITH MINERALS) tablet Take 1 tablet by mouth daily.   Yes [provider]  QUEtiapine (SEROQUEL) 25 MG tablet Take 25 mg by mouth 3 (three) times daily. (take with 50mg  tablet to equal 75mg  total)   Yes [provider]  QUEtiapine (SEROQUEL) 50 MG tablet Take 50 mg by mouth 3 (three) times daily. (take with 25mg  tablet to equal 75mg  total)  Yes [provider]  traZODone (DESYREL) 50 MG tablet Take 50 mg by mouth daily in the afternoon.   Yes [provider]  levothyroxine (SYNTHROID) 100 MCG tablet Take 1 tablet (100 mcg total) by mouth daily. Patient not taking: No sig reported 04/16/19   [name], PA-C  Memantine HCl-Donepezil HCl [facility]) 7-10 MG CP24 Take 1 capsule by mouth at bedtime. Patient not taking: No sig reported 10/05/19   [name], PA-C    Allergies Bactrim [sulfamethoxazole-trimethoprim]  Family History   Problem Relation Age of Onset  . Alzheimer's disease Mother   . Diabetes Mother        ?  Marland Kitchen Alcohol abuse Father   . Cancer Father        unknown cancer  . Diabetes Other   . Heart disease Other   . Cancer Other   . Colon polyps Other   . Breast cancer Neg Hx     Social History Social History   Tobacco Use  . Smoking status: Passive Smoke Exposure - Never Smoker  . Smokeless tobacco: Never Used  Vaping Use  . Vaping Use: Never used  Substance Use Topics  . Alcohol use: No  . Drug use: No    Review of Systems Level 5 caveat:  history/ROS limited by chronic dementia   ____________________________________________   PHYSICAL EXAM:  VITAL SIGNS: ED Triage Vitals  Enc Vitals Group     BP 06/20/20 2327 (!) 127/98     Pulse Rate 06/20/20 2327 95     Resp 06/20/20 2327 18     Temp --      Temp src --      SpO2 06/20/20 2325 96 %     Weight 06/20/20 2328 63.5 kg (139 lb 15.9 oz)     Height 06/20/20 2328 1.6 m ( )     Head Circumference --      Peak Flow --      Pain Score --      Pain Loc --      Pain Edu? --      Excl. in GC? --     Constitutional: Awake and alert, uncomfortable, oriented only to self. Eyes: Conjunctivae are normal.  Head: Atraumatic. Nose: No congestion/rhinnorhea. Mouth/Throat: Patient is wearing a mask. Neck: No stridor.  No meningeal signs.   Cardiovascular: Mild tachycardia, regular rhythm. Good peripheral circulation. Respiratory: Normal respiratory effort.  No retractions. Gastrointestinal: Soft and nondistended but with severe tenderness to palpation throughout the abdomen with involuntary guarding.  No specific localized peritonitis. Musculoskeletal: No lower extremity tenderness nor edema. No gross deformities of extremities. Neurologic:  Normal speech and language. No gross focal neurologic deficits are appreciated.  Skin:  Skin is warm, dry and intact.   ____________________________________________   LABS (all labs  ordered are listed, but only abnormal results are displayed)  Labs Reviewed  COMPREHENSIVE METABOLIC PANEL - Abnormal; Notable for the following components:      Result Value   Glucose, Bld 169 (*)    Total Protein 6.3 (*)    All other components within normal limits  CBC WITH DIFFERENTIAL/PLATELET - Abnormal; Notable for the following components:   WBC 17.8 (*)    Neutro Abs 16.3 (*)    Lymphs Abs 0.3 (*)    Monocytes Absolute 1.1 (*)    All other components within normal limits  LACTIC ACID, PLASMA - Abnormal; Notable for the following components:   Lactic Acid, Venous 2.3 (*)  All other components within normal limits  LACTIC ACID, PLASMA - Abnormal; Notable for the following components:   Lactic Acid, Venous 2.6 (*)    All other components within normal limits  LACTIC ACID, PLASMA - Abnormal; Notable for the following components:   Lactic Acid, Venous 2.3 (*)    All other components within normal limits  URINALYSIS, COMPLETE (UACMP) WITH MICROSCOPIC - Abnormal; Notable for the following components:   Color, Urine YELLOW (*)    APPearance HAZY (*)    Glucose, UA 50 (*)    Ketones, ur 5 (*)    Nitrite POSITIVE (*)    All other components within normal limits  RESP PANEL BY RT-PCR (FLU A&B, COVID) ARPGX2  CULTURE, BLOOD (ROUTINE X 2)  CULTURE, BLOOD (ROUTINE X 2)  URINE CULTURE  PROTIME-INR  LIPASE, BLOOD  APTT  TYPE AND SCREEN   ____________________________________________  EKG  ED ECG REPORT I, Loleta Rose, the attending physician, personally viewed and interpreted this ECG.  Date: 06/21/2020 EKG Time: 00: 04 Rate: 91 Rhythm: normal sinus rhythm QRS Axis: normal Intervals: Left anterior fascicular block ST/T Wave abnormalities: Non-specific ST segment / T-wave changes, but no clear evidence of acute ischemia. Narrative Interpretation: no definitive evidence of acute ischemia; does not meet STEMI  criteria.   ____________________________________________  RADIOLOGY I, Loleta Rose, personally viewed and evaluated these images (plain radiographs) as part of my medical decision making, as well as reviewing the written report by the radiologist.  I also discussed the case by phone with Dr. Elvera Maria the hospitalist.  ED MD interpretation: Pneumatosis with necrotic bowel and retroperitoneal pneumoperitoneum.  Official radiology report(s): CT Angio Abd/Pel W and/or Wo Contrast  Result Date: 06/21/2020 CLINICAL DATA:  GI bleeding EXAM: CTA ABDOMEN AND PELVIS WITHOUT AND WITH CONTRAST TECHNIQUE: Multidetector CT imaging of the abdomen and pelvis was performed using the standard protocol during bolus administration of intravenous contrast. Multiplanar reconstructed images and MIPs were obtained and reviewed to evaluate the vascular anatomy. CONTRAST:  OMNIPAQUE IOHEXOL 350 MG/ML SOLN COMPARISON:  CT abdomen pelvis 12/03/2014 FINDINGS: VASCULAR Aorta: Scattered calcified and noncalcified atheromatous plaque is present within the normal caliber abdominal aorta. No acute luminal abnormality. No abnormal vessel wall thickening. Extensive retroperitoneal free air is present, favored to be related to the patient's colorectal process detailed below. Celiac: Mild ostial plaque narrowing with additional plaque seen throughout the branches, particularly with the splenic artery with a small partially calcified 7 mm splenic artery aneurysm (7/17). No other acute or significant vascular abnormality. No hemodynamically significant stenosis or occlusion. SMA: Mild ostial plaque narrowing. No evidence of aneurysm, dissection or vasculitis. No other significant stenosis or occlusion. Renals: Moderate bilateral ostial plaque narrowing. No evidence of aneurysm, dissection or vasculitis. Convincing features fibromuscular dysplasia. IMA: Patent without evidence of aneurysm, dissection, vasculitis or significant stenosis.  Inflow: Calcified atheromatous plaque throughout the proximal inflow vasculature. No evidence of aneurysm, dissection or vasculitis. Proximal Outflow: Bilateral common femoral and visualized portions of the superficial and profunda femoral arteries are minimally calcified but otherwise patent without evidence of aneurysm, dissection, vasculitis or significant stenosis. Veins: Flattened appearance of the IVC may reflect hypovolemic state. No major venous abnormality or large venous occlusions. Portal and hepatic veins appear patent Review of the MIP images confirms the above findings. NON-VASCULAR Lower chest: Subpleural reticular changes are favored to be atelectatic in nature. Normal heart size. No pericardial effusion. Hepatobiliary: Few subcentimeter hypoattenuating foci in the liver too small to fully characterize on CT  imaging but statistically likely benign. No concerning focal liver lesion. Slight prominence of the biliary tree is likely within the age-appropriate normal. Physiologic distension of the gallbladder without pericholecystic fluid or inflammation. No visible calcified gallstones. Pancreas: Mild pancreatic atrophy. No pancreatic ductal dilatation or surrounding inflammatory changes. Spleen: Normal spleen. Adrenals/Urinary Tract: Normal adrenal glands. A genius Lea enhancing mixed solid and cystic lesion arising from the upper pole right kidney measuring up to 3.7 x 3.5 x 3.5 cm in size (7/19) concerning for a solid renal neoplasm, favor RCC. Additional fluid attenuation cysts bilaterally. Bilateral extrarenal pelves. No obstructive urolithiasis or hydronephrosis. Urinary bladder is unremarkable for the degree of distention. Stomach/Bowel: Large patulous fluid-filled hiatal hernia. Distal stomach and duodenum are unremarkable. No small bowel thickening or dilatation. A normal appendix is visualized. Proximal colonic segments are unremarkable. There is focal segmental thickening of the rectosigmoid  colon with surrounding air likely contained either within the bowel wall or mesocolon and tracking into the retroperitoneal tissue planes. Surrounding inflammatory changes and small volume of retroperitoneal free fluid as well. No resulting obstruction. No convincing sites of active contrast extravasation or accumulation within the bowel Lymphatic: No suspicious or enlarged lymph nodes in the included lymphatic chains. Reproductive: Uterus is surgically absent. No concerning adnexal lesions. Other: The free air and fluid likely contained into the mesial colonic planes and retroperitoneum. No large bowel containing hernia. Musculoskeletal: The osseous structures appear diffusely demineralized which may limit detection of small or nondisplaced fractures. Grade 1 anterolisthesis L4 on 5 without spondylolysis. Multilevel discogenic and facet degenerative changes throughout the included spine. Additional degenerative changes in the hips and pelvis as well as in the hands and wrist partially included within the level of this exam. No acute osseous abnormality or suspicious osseous lesion. IMPRESSION: Vascular: 1. No acute sites of active contrast extravasation or accumulation of contrast within the bowel to suggest active arterial site of bleeding. 2. Air surrounding the aorta and inflow vasculature is likely related to the colorectal process detailed below. No features of acute aortitis or other acute aortic abnormality is seen at this time. 3.  Aortic Atherosclerosis (ICD10-I70.0). 4. Moderate ostial plaque narrowing of the bilateral renal arteries. 5. Mild plaque narrowing the ostium of the celiac axis and SMA. 6. 7 mm splenic artery aneurysm. 7. Flattened appearance of the IVC, correlate with hydration status. Nonvascular: 1. Thickened rectosigmoid colon with pneumatosis and extraluminal gas and free fluid largely confined within the planes of the mesocolon and retroperitoneum. Concerning for perforation and possible  bowel wall necrosis. Urgent surgical consultation is warranted. 2. 3.7 cm heterogeneously enhancing solid renal mass in the upper pole right kidney, highly worrisome for renal cell carcinoma. 3. Large hiatal hernia. These results were called by telephone at the time of interpretation on 06/21/2020 at 4:27 am to provider [facility] , who verbally acknowledged these results. Electronically Signed   By: Kreg Shropshire M.D.   On: 06/21/2020 04:28    ____________________________________________   PROCEDURES   Procedure(s) performed (including Critical Care):  .1-3 Lead EKG Interpretation Performed by: Loleta Rose, MD Authorized by: Loleta Rose, MD     Interpretation: abnormal     ECG rate:  106   ECG rate assessment: tachycardic     Rhythm: sinus tachycardia     Ectopy: none     Conduction: normal   .Critical Care Performed by: Loleta Rose, MD Authorized by: Loleta Rose, MD   Critical care provider statement:    Critical care time (minutes):  75   Critical care time was exclusive of:  Separately billable procedures and treating other patients   Critical care was necessary to treat or prevent imminent or life-threatening deterioration of the following conditions:  Sepsis   Critical care was time spent personally by me on the following activities:  Development of treatment plan with patient or surrogate, discussions with consultants, evaluation of patient's response to treatment, examination of patient, obtaining history from patient or surrogate, ordering and performing treatments and interventions, ordering and review of laboratory studies, ordering and review of radiographic studies, pulse oximetry, re-evaluation of patient's condition and review of old charts     ____________________________________________   INITIAL IMPRESSION / MDM / ASSESSMENT AND PLAN / ED COURSE  As part of my medical decision making, I reviewed the following data within the electronic MEDICAL RECORD NUMBER  History obtained from family, Nursing notes reviewed and incorporated, Labs reviewed , EKG interpreted , Old chart reviewed, Discussed with admitting physician (Dr. Joylene Igo), Discussed with radiologist, A consult was requested and obtained from this/these consultant(s) Surgery and Notes from prior ED visits   Differential diagnosis includes, but is not limited to, diverticular bleed, neoplasm, AV malformation, hemorrhoids, ischemic colitis, nonspecific infectious colitis, upper GI bleed.  The patient is on the cardiac monitor to evaluate for evidence of arrhythmia and/or significant heart rate changes.  Patient comes with a facesheet that indicates she is full code but no other documentation.  I will contact her family to verify her CODE STATUS and plan of care.  She is currently very uncomfortable but normotensive with only mild tachycardia.  I will provide morphine 4 mg IV and Zofran 4 mg IV.  Small fluid bolus to support blood pressure for now but to try to avoid hemodilution.  Standard GI bleeding work-up is proceeding including 2 large-bore IV placement, coagulation studies, etc.  No role for octreotide at this time.  Pantoprazole bolus plus infusion.  Anticipate CTA abdomen given the degree of pain/tenderness she is experiencing and the patient will need to be admitted.     Clinical Course as of 06/21/20 0708  Fri Jun 20, 2020  2333 Of note, I also ordered TXA 1000 mg IV bolus for the acute bleeding.  I see no history of blood clots to the legs of the lungs in her medical history. [CF]  2335 I spoke by phone with the patient's daughter, Dustyn Armbrister, who is her power of attorney.  Her phone number is [phone].  She is out of town but is going to have another family member come to stay with the patient.  She confirmed the patient is full code and she gave verbal consent for blood products if they are necessary after we had a risks and benefits discussion.  She confirmed that we should stabilize  the patient and do everything we can to help her.  She also confirmed that the patient has been vaccinated and boosted for COVID-19, and she had COVID-19 previously. [CF]  Sat Jun 21, 2020  0008 The patient's white blood cell count of 17.8.,  Combined with her abdominal pain (suspected source of infection) and her mild tachycardia qualify her for sepsis even though the tachycardia could be the result of GI blood loss.  I decided to err on the side of caution and have made her code sepsis with empiric antibiotics of cefepime 2 g IV and metronidazole 500 mg IV.  Blood cultures are ordered and I ordered 1 L lactated ringer IV bolus as  an alternative to the initial 500 mL normal saline bolus.  Initial lactic acid is pending. [CF]  0043 Comprehensive metabolic panel generally reassuring with normal electrolytes and normal renal function.  Proceeding with CTA abdomen/pelvis. [CF]  0043 SARS Coronavirus 2 by RT PCR: NEGATIVE [CF]  0043 Hemoglobin: 13.1 Fortunately the patient's baseline hemoglobin is 13 and she does not require emergent blood products at this time. [CF]  0057 Lactic Acid, Venous(!!): 2.3 [CF]  0143 Since the patient meets sepsis criteria and has an elevated lactic acid of 2.3, I will target the 30 mL/kg IV fluid bolus but giving another liter of lactated Ringer's.  The patient just returned from CTA abdomen/pelvis. [CF]  0212 Patient is quite agitated which the son-in-law who is now at the bedside says is her baseline.  I am giving droperidol 2.5 mg IV as a calming agent and as an adjuvant for pain. [CF]  [phone] Discussed with radiology, they are behind due to only having two radiologists, and will get to the interpretation as soon as they can. [CF]  0351 Lactic Acid, Venous(!!): 2.6 Lactic acid increasing in spite of fluids.  Sepsis reassessment complete.  Patient remains agitated but hemodynamically stable. [CF]  [phone] Given the concern for intra-abdominal infection, I called and spoke with  fellow Dr. Maia Plan with surgery.  He will review the CT scan and call me back. [CF]  [phone] Discussed with Dr. Elvera Maria with radiology in the meantime.  He called me with the results of pneumatosis with an area of free air in the retroperitoneum and bowel necrosis.  Dr. Maia Plan also call me back and agrees with this and is on the way into the emergency department to see the patient.  I will update the patient's family at bedside. [CF]  [phone] Dr. Maia Plan at bedside [CF]  [phone] Dr. Maia Plan had discussion with family about grave prognosis.  They are deciding between aggressive care (surgery) and comfort care.  [CF]  [phone] The family is all in agreement, including the husband, that they want to make her full comfort care.  No aggressive treatment, no surgery.  Dr. Maia Plan was part of that conversation as well and agrees that this is what is best for the patient.  I personally confirmed at the bedside that this is the goal.  I am making her DNR and ordering comfort care as well as consulting the hospitalist for admission to manage her end-of-life/comfort care. [CF]  0703 Discussed case by phone with Dr. Joylene Igo with the hospitalist service who will admit. [CF]    Clinical Course User Index [CF] Loleta Rose, MD     ____________________________________________  FINAL CLINICAL IMPRESSION(S) / ED DIAGNOSES  Final diagnoses:  Ischemic necrosis of small bowel (HCC)  Rectal bleeding  Pneumatosis coli  Pneumoperitoneum  Severe sepsis (HCC)  Palliative care status  DNR (do not resuscitate)     MEDICATIONS GIVEN DURING THIS VISIT:  Medications  sodium chloride flush (NS) 0.9 % injection 3 mL (has no administration in time range)  sodium chloride flush (NS) 0.9 % injection 3 mL (has no administration in time range)  0.9 %  sodium chloride infusion (has no administration in time range)  morphine 2 MG/ML injection 1 mg (1 mg Intravenous Given 06/21/20 0700)  haloperidol (HALDOL) tablet 0.5 mg (has no  administration in time range)    Or  haloperidol (HALDOL) 2 MG/ML solution 0.5 mg (has no administration in time range)    Or  haloperidol lactate (HALDOL) injection 0.5 mg (  has no administration in time range)  tranexamic acid (CYKLOKAPRON) IVPB 1,000 mg (0 mg Intravenous Stopped 06/21/20 0042)  pantoprazole (PROTONIX) 80 mg in sodium chloride 0.9 % 100 mL IVPB (0 mg Intravenous Stopped 06/21/20 0216)  lactated ringers bolus 1,000 mL (0 mLs Intravenous Stopped 06/21/20 0120)  ceFEPIme (MAXIPIME) 2 g in sodium chloride 0.9 % 100 mL IVPB (0 g Intravenous Stopped 06/21/20 0120)  metroNIDAZOLE (FLAGYL) IVPB 500 mg (0 mg Intravenous Stopped 06/21/20 0219)  iohexol (OMNIPAQUE) 350 MG/ML injection 100 mL (100 mLs Intravenous Contrast Given 06/21/20 0142)  lactated ringers bolus 1,000 mL (0 mLs Intravenous Stopped 06/21/20 0307)  droperidol (INAPSINE) 2.5 MG/ML injection 2.5 mg (2.5 mg Intravenous Given 06/21/20 0222)  morphine 4 MG/ML injection 4 mg (4 mg Intravenous Given 06/21/20 0303)  droperidol (INAPSINE) 2.5 MG/ML injection 2.5 mg (2.5 mg Intravenous Given 06/21/20 0359)  lactated ringers bolus 1,000 mL (0 mLs Intravenous Stopped 06/21/20 1610)     ED Discharge Orders    None      *Please note:  Katelyn Baker was evaluated in Emergency Department on 06/21/2020 for the symptoms described in the history of present illness. She was evaluated in the context of the global COVID-19 pandemic, which necessitated consideration that the patient might be at risk for infection with the SARS-CoV-2 virus that causes COVID-19. Institutional protocols and algorithms that pertain to the evaluation of patients at risk for COVID-19 are in a state of rapid change based on information released by regulatory bodies including the CDC and federal and state organizations. These policies and algorithms were followed during the patient's care in the ED.  Some ED evaluations and interventions may be delayed as a result of limited  staffing during and after the pandemic.*  Note:  This document was prepared using Dragon voice recognition software and may include unintentional dictation errors.   Loleta Rose, MD 06/21/20 [phone]

## 2020-06-21 ENCOUNTER — Emergency Department: Payer: Medicare PPO

## 2020-06-21 DIAGNOSIS — Z79890 Hormone replacement therapy (postmenopausal): Secondary | ICD-10-CM | POA: Diagnosis not present

## 2020-06-21 DIAGNOSIS — G301 Alzheimer's disease with late onset: Secondary | ICD-10-CM | POA: Diagnosis present

## 2020-06-21 DIAGNOSIS — E785 Hyperlipidemia, unspecified: Secondary | ICD-10-CM | POA: Diagnosis present

## 2020-06-21 DIAGNOSIS — K55029 Acute infarction of small intestine, extent unspecified: Secondary | ICD-10-CM | POA: Diagnosis present

## 2020-06-21 DIAGNOSIS — K219 Gastro-esophageal reflux disease without esophagitis: Secondary | ICD-10-CM | POA: Diagnosis present

## 2020-06-21 DIAGNOSIS — K631 Perforation of intestine (nontraumatic): Secondary | ICD-10-CM | POA: Diagnosis present

## 2020-06-21 DIAGNOSIS — K625 Hemorrhage of anus and rectum: Secondary | ICD-10-CM | POA: Diagnosis present

## 2020-06-21 DIAGNOSIS — K668 Other specified disorders of peritoneum: Secondary | ICD-10-CM | POA: Diagnosis present

## 2020-06-21 DIAGNOSIS — Z20822 Contact with and (suspected) exposure to covid-19: Secondary | ICD-10-CM | POA: Diagnosis present

## 2020-06-21 DIAGNOSIS — Z86010 Personal history of colonic polyps: Secondary | ICD-10-CM | POA: Diagnosis not present

## 2020-06-21 DIAGNOSIS — I10 Essential (primary) hypertension: Secondary | ICD-10-CM | POA: Diagnosis present

## 2020-06-21 DIAGNOSIS — Z882 Allergy status to sulfonamides status: Secondary | ICD-10-CM | POA: Diagnosis not present

## 2020-06-21 DIAGNOSIS — Z515 Encounter for palliative care: Secondary | ICD-10-CM | POA: Diagnosis not present

## 2020-06-21 DIAGNOSIS — N2889 Other specified disorders of kidney and ureter: Secondary | ICD-10-CM | POA: Diagnosis present

## 2020-06-21 DIAGNOSIS — Z820 Family history of epilepsy and other diseases of the nervous system: Secondary | ICD-10-CM | POA: Diagnosis not present

## 2020-06-21 DIAGNOSIS — K6389 Other specified diseases of intestine: Secondary | ICD-10-CM | POA: Diagnosis present

## 2020-06-21 DIAGNOSIS — E7800 Pure hypercholesterolemia, unspecified: Secondary | ICD-10-CM | POA: Diagnosis present

## 2020-06-21 DIAGNOSIS — Z79899 Other long term (current) drug therapy: Secondary | ICD-10-CM | POA: Diagnosis not present

## 2020-06-21 DIAGNOSIS — Z66 Do not resuscitate: Secondary | ICD-10-CM | POA: Diagnosis present

## 2020-06-21 DIAGNOSIS — Z90710 Acquired absence of both cervix and uterus: Secondary | ICD-10-CM | POA: Diagnosis not present

## 2020-06-21 DIAGNOSIS — A419 Sepsis, unspecified organism: Secondary | ICD-10-CM | POA: Diagnosis present

## 2020-06-21 DIAGNOSIS — K449 Diaphragmatic hernia without obstruction or gangrene: Secondary | ICD-10-CM | POA: Diagnosis present

## 2020-06-21 DIAGNOSIS — F0281 Dementia in other diseases classified elsewhere with behavioral disturbance: Secondary | ICD-10-CM | POA: Diagnosis present

## 2020-06-21 DIAGNOSIS — R6520 Severe sepsis without septic shock: Secondary | ICD-10-CM | POA: Diagnosis present

## 2020-06-21 DIAGNOSIS — E890 Postprocedural hypothyroidism: Secondary | ICD-10-CM | POA: Diagnosis present

## 2020-06-21 LAB — URINALYSIS, COMPLETE (UACMP) WITH MICROSCOPIC
Bacteria, UA: NONE SEEN
Bilirubin Urine: NEGATIVE
Glucose, UA: 50 mg/dL — AB
Hgb urine dipstick: NEGATIVE
Ketones, ur: 5 mg/dL — AB
Leukocytes,Ua: NEGATIVE
Nitrite: POSITIVE — AB
Protein, ur: NEGATIVE mg/dL
Specific Gravity, Urine: 1.028 (ref 1.005–1.030)
Squamous Epithelial / HPF: NONE SEEN (ref 0–5)
pH: 8 (ref 5.0–8.0)

## 2020-06-21 LAB — COMPREHENSIVE METABOLIC PANEL
ALT: 19 U/L (ref 0–44)
AST: 23 U/L (ref 15–41)
Albumin: 3.8 g/dL (ref 3.5–5.0)
Alkaline Phosphatase: 49 U/L (ref 38–126)
Anion gap: 9 (ref 5–15)
BUN: 20 mg/dL (ref 8–23)
CO2: 24 mmol/L (ref 22–32)
Calcium: 8.9 mg/dL (ref 8.9–10.3)
Chloride: 106 mmol/L (ref 98–111)
Creatinine, Ser: 0.86 mg/dL (ref 0.44–1.00)
GFR, Estimated: >60 mL/min (ref >60)
Glucose, Bld: 169 mg/dL — ABNORMAL HIGH (ref 70–99)
Potassium: 3.8 mmol/L (ref 3.5–5.1)
Sodium: 139 mmol/L (ref 135–145)
Total Bilirubin: 0.9 mg/dL (ref 0.3–1.2)
Total Protein: 6.3 g/dL — ABNORMAL LOW (ref 6.5–8.1)

## 2020-06-21 LAB — CBC WITH DIFFERENTIAL/PLATELET
Abs Immature Granulocytes: 0.07 10*3/uL (ref 0.00–0.07)
Basophils Absolute: 0 10*3/uL (ref 0.0–0.1)
Basophils Relative: 0 %
Eosinophils Absolute: 0 10*3/uL (ref 0.0–0.5)
Eosinophils Relative: 0 %
HCT: 39.1 % (ref 36.0–46.0)
Hemoglobin: 13.1 g/dL (ref 12.0–15.0)
Immature Granulocytes: 0 %
Lymphocytes Relative: 2 %
Lymphs Abs: 0.3 10*3/uL — ABNORMAL LOW (ref 0.7–4.0)
MCH: 31 pg (ref 26.0–34.0)
MCHC: 33.5 g/dL (ref 30.0–36.0)
MCV: 92.4 fL (ref 80.0–100.0)
Monocytes Absolute: 1.1 10*3/uL — ABNORMAL HIGH (ref 0.1–1.0)
Monocytes Relative: 6 %
Neutro Abs: 16.3 10*3/uL — ABNORMAL HIGH (ref 1.7–7.7)
Neutrophils Relative %: 92 %
Platelets: 169 10*3/uL (ref 150–400)
RBC: 4.23 MIL/uL (ref 3.87–5.11)
RDW: 12.6 % (ref 11.5–15.5)
WBC: 17.8 10*3/uL — ABNORMAL HIGH (ref 4.0–10.5)
nRBC: 0 % (ref 0.0–0.2)

## 2020-06-21 LAB — RESP PANEL BY RT-PCR (FLU A&B, COVID) ARPGX2
Influenza A by PCR: NEGATIVE
Influenza B by PCR: NEGATIVE
SARS Coronavirus 2 by RT PCR: NEGATIVE

## 2020-06-21 LAB — TYPE AND SCREEN
ABO/RH(D): O POS
Antibody Screen: NEGATIVE

## 2020-06-21 LAB — LACTIC ACID, PLASMA
Lactic Acid, Venous: 2.6 mmol/L (ref 0.5–1.9)
Lactic Acid, Venous: 2.3 mmol/L (ref 0.5–1.9)
Lactic Acid, Venous: 2.3 mmol/L (ref 0.5–1.9)

## 2020-06-21 LAB — PROTIME-INR
INR: 1.1 (ref 0.8–1.2)
Prothrombin Time: 14.5 seconds (ref 11.4–15.2)

## 2020-06-21 LAB — APTT: aPTT: 28 seconds (ref 24–36)

## 2020-06-21 LAB — LIPASE, BLOOD: Lipase: 26 U/L (ref 11–51)

## 2020-06-21 MED ORDER — HALOPERIDOL LACTATE 5 MG/ML IJ SOLN
0.5000 mg | INTRAMUSCULAR | Status: DC | PRN
  Administered 2020-06-21: 0.5 mg via INTRAVENOUS
  Filled 2020-06-21: qty 1

## 2020-06-21 MED ORDER — MORPHINE SULFATE (PF) 2 MG/ML IV SOLN
2.0000 mg | INTRAVENOUS | Status: DC | PRN
Start: 2020-06-21 — End: 2020-06-30
  Administered 2020-06-21 – 2020-06-30 (×23): 2 mg via INTRAVENOUS
  Filled 2020-06-21 (×24): qty 1

## 2020-06-21 MED ORDER — GLYCOPYRROLATE 0.2 MG/ML IJ SOLN: 0.2000 mg | INTRAMUSCULAR | Status: DC | PRN

## 2020-06-21 MED ORDER — LACTATED RINGERS IV BOLUS
1000.0000 mL | Freq: Once | INTRAVENOUS | Status: AC
  Administered 2020-06-21: 1000 mL via INTRAVENOUS

## 2020-06-21 MED ORDER — LACTATED RINGERS IV BOLUS (SEPSIS)
1000.0000 mL | Freq: Once | INTRAVENOUS | Status: AC
  Administered 2020-06-21: 1000 mL via INTRAVENOUS

## 2020-06-21 MED ORDER — HALOPERIDOL LACTATE 5 MG/ML IJ SOLN
2.0000 mg | Freq: Four times a day (QID) | INTRAMUSCULAR | Status: DC | PRN
  Administered 2020-06-21: 2 mg via INTRAVENOUS
  Filled 2020-06-21 (×2): qty 1

## 2020-06-21 MED ORDER — ACETAMINOPHEN 650 MG RE SUPP: 650.0000 mg | Freq: Four times a day (QID) | RECTAL | Status: DC | PRN

## 2020-06-21 MED ORDER — GLYCOPYRROLATE 1 MG PO TABS: 1.0000 mg | ORAL_TABLET | ORAL | Status: DC | PRN

## 2020-06-21 MED ORDER — DROPERIDOL 2.5 MG/ML IJ SOLN
2.5000 mg | Freq: Once | INTRAMUSCULAR | Status: AC | PRN
  Administered 2020-06-21: 2.5 mg via INTRAVENOUS
  Filled 2020-06-21: qty 2

## 2020-06-21 MED ORDER — ONDANSETRON HCL 4 MG/2ML IJ SOLN: 4.0000 mg | Freq: Four times a day (QID) | INTRAMUSCULAR | Status: DC | PRN

## 2020-06-21 MED ORDER — MORPHINE SULFATE (PF) 4 MG/ML IV SOLN
4.0000 mg | Freq: Once | INTRAVENOUS | Status: AC | PRN
  Administered 2020-06-21: 4 mg via INTRAVENOUS

## 2020-06-21 MED ORDER — HALOPERIDOL LACTATE 5 MG/ML IJ SOLN
2.0000 mg | Freq: Once | INTRAMUSCULAR | Status: AC
  Administered 2020-06-21: 2 mg via INTRAVENOUS

## 2020-06-21 MED ORDER — HYDROMORPHONE HCL 1 MG/ML IJ SOLN: 0.5000 mg | INTRAMUSCULAR | Status: DC | PRN

## 2020-06-21 MED ORDER — HALOPERIDOL LACTATE 2 MG/ML PO CONC: 0.5000 mg | ORAL | Status: DC | PRN

## 2020-06-21 MED ORDER — SODIUM CHLORIDE 0.9 % IV SOLN
2.0000 g | Freq: Once | INTRAVENOUS | Status: AC
  Administered 2020-06-21: 2 g via INTRAVENOUS
  Filled 2020-06-21: qty 2

## 2020-06-21 MED ORDER — IOHEXOL 350 MG/ML SOLN
100.0000 mL | Freq: Once | INTRAVENOUS | Status: AC | PRN
  Administered 2020-06-21: 100 mL via INTRAVENOUS

## 2020-06-21 MED ORDER — SODIUM CHLORIDE 0.9 % IV SOLN: 250.0000 mL | INTRAVENOUS | Status: DC | PRN

## 2020-06-21 MED ORDER — DROPERIDOL 2.5 MG/ML IJ SOLN
2.5000 mg | Freq: Once | INTRAMUSCULAR | Status: AC
  Administered 2020-06-21: 2.5 mg via INTRAVENOUS
  Filled 2020-06-21: qty 2

## 2020-06-21 MED ORDER — HALOPERIDOL LACTATE 2 MG/ML PO CONC
0.5000 mg | ORAL | Status: DC | PRN
  Filled 2020-06-21: qty 0.3

## 2020-06-21 MED ORDER — BIOTENE DRY MOUTH MT LIQD
15.0000 mL | OROMUCOSAL | Status: DC | PRN
  Filled 2020-06-21: qty 15

## 2020-06-21 MED ORDER — ONDANSETRON 4 MG PO TBDP
4.0000 mg | ORAL_TABLET | Freq: Four times a day (QID) | ORAL | Status: DC | PRN
Start: 2020-06-21 — End: 2020-06-30

## 2020-06-21 MED ORDER — HALOPERIDOL 0.5 MG PO TABS: 0.5000 mg | ORAL_TABLET | ORAL | Status: DC | PRN

## 2020-06-21 MED ORDER — HALOPERIDOL 0.5 MG PO TABS
0.5000 mg | ORAL_TABLET | ORAL | Status: DC | PRN
  Filled 2020-06-21: qty 1

## 2020-06-21 MED ORDER — MORPHINE SULFATE (PF) 2 MG/ML IV SOLN
1.0000 mg | INTRAVENOUS | Status: DC | PRN
  Administered 2020-06-21 (×2): 1 mg via INTRAVENOUS
  Filled 2020-06-21 (×3): qty 1

## 2020-06-21 MED ORDER — SODIUM CHLORIDE 0.9% FLUSH: 3.0000 mL | INTRAVENOUS | Status: DC | PRN

## 2020-06-21 MED ORDER — DROPERIDOL 2.5 MG/ML IJ SOLN: 2.5000 mg | Freq: Once | INTRAMUSCULAR | Status: DC

## 2020-06-21 MED ORDER — MORPHINE SULFATE (PF) 4 MG/ML IV SOLN
4.0000 mg | Freq: Once | INTRAVENOUS | Status: DC
  Filled 2020-06-21: qty 1

## 2020-06-21 MED ORDER — SODIUM CHLORIDE 0.9% FLUSH
3.0000 mL | Freq: Two times a day (BID) | INTRAVENOUS | Status: DC
  Administered 2020-06-21 – 2020-06-29 (×15): 3 mL via INTRAVENOUS

## 2020-06-21 MED ORDER — METRONIDAZOLE 500 MG/100ML IV SOLN
500.0000 mg | Freq: Once | INTRAVENOUS | Status: AC
  Administered 2020-06-21: 500 mg via INTRAVENOUS
  Filled 2020-06-21: qty 100

## 2020-06-21 MED ORDER — HALOPERIDOL LACTATE 5 MG/ML IJ SOLN: 0.5000 mg | INTRAMUSCULAR | Status: DC | PRN

## 2020-06-21 MED ORDER — LORAZEPAM 2 MG/ML IJ SOLN
2.0000 mg | INTRAMUSCULAR | Status: DC | PRN
  Administered 2020-06-21 – 2020-06-30 (×25): 2 mg via INTRAVENOUS
  Filled 2020-06-21 (×26): qty 1

## 2020-06-21 MED ORDER — POLYVINYL ALCOHOL 1.4 % OP SOLN
1.0000 [drp] | Freq: Four times a day (QID) | OPHTHALMIC | Status: DC | PRN
  Filled 2020-06-21: qty 15

## 2020-06-21 MED ORDER — ACETAMINOPHEN 325 MG PO TABS: 650.0000 mg | ORAL_TABLET | Freq: Four times a day (QID) | ORAL | Status: DC | PRN

## 2020-06-21 NOTE — H&P (Addendum)
History and Physical    Katelyn Mediciancy J Gholson ZOX:[id number] DOB: Sep 19, 1945 DOA: 06/20/2020  PCP: Mortimer Friesurl, [place], [place]   [facility]  I have personally briefly reviewed patient's old medical records in [facility]  Chief Complaint: Rectal bleeding Most of the history was obtained from patient's family at the bedside as well as ER notes.   HPI: Katelyn Baker is a 75 y.o. female with medical history significant for advanced dementia, hypothyroidism and GERD who [facility] for evaluation of rectal bleeding. The staff at the facility had gone to change the patient when she was noted to have a large amount of blood which is described as bright red and having some clots.  Patient reported having pain in her abdomen as well. CT done in the emergency room showed  thickened rectosigmoid colon with pneumatosis and extraluminal gas and free fluid largely confined within the planes of the mesocolon and retroperitoneum. Concerning for perforation and possible bowel wall necrosis. Urgent surgical consultation is warranted. 3.7 cm heterogeneously enhancing solid renal mass in the upper pole right kidney, highly worrisome for renal cell carcinoma.Large hiatal hernia. Surgery was consulted by emergency room physician and after conversation with family, they have decided to keep the patient comfortable with no further aggressive measures. I am unable to do review of systems on this patient due to her mental status. Labs show sodium 139, potassium 3.8, chloride 106, bicarb 24, glucose 169, BUN 20, creatinine 0.86, alkaline phosphatase 49, albumin 3.8, lipase 26, AST 23, ALT 19, total protein 6.3, lactic acid 2.6 >> 2.3, white count 17.8, hemoglobin 13.1, hematocrit 39.1, MCV 92.4, RDW 12.6, platelet count 169, PT 14.5, INR 1.1 Urine analysis was positive for nitrites Respiratory viral panel was negative   ED Course: Patient is  a 75 year old female who presents to the emergency room for evaluation of rectal bleeding.  Patient had a CT angiogram of the abdomen pelvis done which showed  thickened rectosigmoid colon with pneumatosis and extraluminal gas and free fluid largely confined within the planes of the mesocolon and retroperitoneum. Concerning for perforation and possible bowel wall necrosis. Urgent surgical consultation is warranted. Surgery was consulted and after discussion with patient's family, the plan was to place patient on comfort measures with no further aggressive intervention.  She will be admitted to the hospital for comfort measures.    Review of Systems: As per HPI otherwise all other systems reviewed and negative.    Past Medical History:  Diagnosis Date  . GERD (gastroesophageal reflux disease)   . Hyperlipidemia   . Thyroid disease     Past Surgical History:  Procedure Laterality Date  . ABDOMINAL HYSTERECTOMY     partial  . bladder tact    . BREAST BIOPSY Left 1980'S   EXCISIONAL - NEG  . BREAST SURGERY  2000's   biopsy  . THYROIDECTOMY, PARTIAL  1990's     reports that she is a non-smoker but has been exposed to tobacco smoke. She has never used smokeless tobacco. She reports that she does not drink alcohol and does not use drugs.  Allergies  Allergen Reactions  . Bactrim [Sulfamethoxazole-Trimethoprim] Rash    Family History  Problem Relation Age of Onset  . Alzheimer's disease Mother   . Diabetes Mother        ?  Marland Kitchen. Alcohol abuse Father   . Cancer Father  unknown cancer  . Diabetes Other   . Heart disease Other   . Cancer Other   . Colon polyps Other   . Breast cancer Neg Hx       Prior to Admission medications   Medication Sig Start Date End Date Taking? Authorizing Provider  buPROPion (WELLBUTRIN XL) 150 MG 24 hr tablet Take 1 tablet (150 mg total) by mouth daily. 04/16/19  Yes [name], PA-C  Calcium Carbonate (CALCIUM 600 PO) Take 600 mg by  mouth daily.    Yes [provider]  levothyroxine (SYNTHROID) 112 MCG tablet Take 112 mcg by mouth daily before breakfast.   Yes [provider]  lisinopril (ZESTRIL) 10 MG tablet Take 1 tablet (10 mg total) by mouth daily. 04/16/19  Yes [name], PA-C  LORazepam (ATIVAN) 0.5 MG tablet Take 0.5 mg by mouth 2 (two) times daily as needed for sedation.   Yes [provider]  Multiple Vitamin (MULTIVITAMIN) tablet Take 1 tablet by mouth daily.   Yes [provider]  Multiple Vitamins-Minerals (MULTIVITAMIN WITH MINERALS) tablet Take 1 tablet by mouth daily.   Yes [provider]  QUEtiapine (SEROQUEL) 25 MG tablet Take 25 mg by mouth 3 (three) times daily. (take with  tablet to equal  total)   Yes [provider]  QUEtiapine (SEROQUEL) 50 MG tablet Take 50 mg by mouth 3 (three) times daily. (take with  tablet to equal  total)   Yes [provider]  traZODone (DESYREL) 50 MG tablet Take 50 mg by mouth daily in the afternoon.   Yes [provider]  levothyroxine (SYNTHROID) 100 MCG tablet Take 1 tablet (100 mcg total) by mouth daily. Patient not taking: No sig reported 04/16/19   [name], PA-C  Memantine HCl-Donepezil HCl [facility]) 7-10 MG CP24 Take 1 capsule by mouth at bedtime. Patient not taking: No sig reported 10/05/19   [name], [place]    Physical Exam: Vitals:   06/21/20 0600 06/21/20 0630 06/21/20 0700 06/21/20 0800  BP: 119/70 (!) 145/78 101/67   Pulse: 92 (!) 105 95 93  Resp: Temp:    98.3 F (36.8 C)  SpO2: 99% 99% 99% 96%  Weight:      Height:         Vitals:   06/21/20 0600 06/21/20 0630 06/21/20 0700 06/21/20 0800  BP: 119/70 (!) 145/78 101/67   Pulse: 92 (!) 105 95 93  Resp: Temp:    98.3 F (36.8 C)  SpO2: 99% 99% 99% 96%  Weight:      Height:          Constitutional:  Lethargic, grimaces to painful stimuli. Not in  any apparent distress HEENT:      Head: Normocephalic and atraumatic.         Eyes: PERLA, EOMI, Conjunctivae are normal. Sclera is non-icteric.       Mouth/Throat: Mucous membranes are dry.       Neck: Supple with no signs of meningismus. Cardiovascular:  Tachycardic. No murmurs, gallops, or rubs. 2+ symmetrical distal pulses are present . No JVD. No LE edema Respiratory: Respiratory effort normal .Lungs sounds clear bilaterally. No wheezes, crackles, or rhonchi.  Gastrointestinal: Soft, diffusely tender, and non distended with hypoactive bowel sounds.  Genitourinary: No CVA tenderness. Musculoskeletal: Nontender with normal range of motion in all extremities. No cyanosis, or erythema of extremities. Neurologic:  Face is symmetric.  Unable to assess Skin: Skin is warm, dry.  No rash or ulcers Psychiatric: Unable to assess   Labs on Admission: I have personally reviewed following labs and imaging studies  CBC: Recent Labs  Lab 06/20/20 2334  WBC 17.8*  NEUTROABS 16.3*  HGB 13.1  HCT 39.1  MCV 92.4  PLT 169   Basic Metabolic Panel: Recent Labs  Lab 06/20/20 2334  NA 139  K 3.8  CL 106  CO2 24  GLUCOSE 169*  BUN 20  CREATININE 0.86  CALCIUM 8.9   GFR: Estimated Creatinine Clearance: 51.5 mL/min (by C-G formula based on SCr of 0.86 mg/dL). Liver Function Tests: Recent Labs  Lab 06/20/20 2334  AST 23  ALT 19  ALKPHOS 49  BILITOT 0.9  PROT 6.3*  ALBUMIN 3.8   Recent Labs  Lab 06/20/20 2334  LIPASE 26   No results for input(s): AMMONIA in the last 168 hours. Coagulation Profile: Recent Labs  Lab 06/20/20 2334  INR 1.1   Cardiac Enzymes: No results for input(s): CKTOTAL, CKMB, CKMBINDEX, TROPONINI in the last 168 hours. BNP (last 3 results) No results for input(s): PROBNP in the last 8760 hours. HbA1C: No results for input(s): HGBA1C in the last 72 hours. CBG: No results for input(s): GLUCAP in the last 168 hours. Lipid Profile: No results for  input(s): CHOL, HDL, LDLCALC, TRIG, CHOLHDL, LDLDIRECT in the last 72 hours. Thyroid Function Tests: No results for input(s): TSH, T4TOTAL, FREET4, T3FREE, THYROIDAB in the last 72 hours. Anemia Panel: No results for input(s): VITAMINB12, FOLATE, FERRITIN, TIBC, IRON, RETICCTPCT in the last 72 hours. Urine analysis:    Component Value Date/Time   COLORURINE YELLOW (A) 06/21/2020 0415   APPEARANCEUR HAZY (A) 06/21/2020 0415   APPEARANCEUR Cloudy (A) 08/01/2017 1049   LABSPEC 1.028 06/21/2020 0415   PHURINE 8.0 06/21/2020 0415   GLUCOSEU 50 (A) 06/21/2020 0415   HGBUR NEGATIVE 06/21/2020 0415   BILIRUBINUR NEGATIVE 06/21/2020 0415   BILIRUBINUR small 05/31/2019 1628   BILIRUBINUR Negative 08/01/2017 1049   KETONESUR 5 (A) 06/21/2020 0415   PROTEINUR NEGATIVE 06/21/2020 0415   UROBILINOGEN 0.2 05/31/2019 1628   NITRITE POSITIVE (A) 06/21/2020 0415   LEUKOCYTESUR NEGATIVE 06/21/2020 0415    Radiological Exams on Admission: CT Angio Abd/Pel W and/or Wo Contrast  Result Date: 06/21/2020 CLINICAL DATA:  GI bleeding EXAM: CTA ABDOMEN AND PELVIS WITHOUT AND WITH CONTRAST TECHNIQUE: Multidetector CT imaging of the abdomen and pelvis was performed using the standard protocol during bolus administration of intravenous contrast. Multiplanar reconstructed images and MIPs were obtained and reviewed to evaluate the vascular anatomy. CONTRAST:  OMNIPAQUE IOHEXOL 350 MG/ML SOLN COMPARISON:  CT abdomen pelvis 12/03/2014 FINDINGS: VASCULAR Aorta: Scattered calcified and noncalcified atheromatous plaque is present within the normal caliber abdominal aorta. No acute luminal abnormality. No abnormal vessel wall thickening. Extensive retroperitoneal free air is present, favored to be related to the patient's colorectal process detailed below. Celiac: Mild ostial plaque narrowing with additional plaque seen throughout the branches, particularly with the splenic artery with a small partially calcified 7 mm  splenic artery aneurysm (7/17). No other acute or significant vascular abnormality. No hemodynamically significant stenosis or occlusion. SMA: Mild ostial plaque narrowing. No evidence of aneurysm, dissection or vasculitis. No other significant stenosis or occlusion. Renals: Moderate bilateral ostial plaque narrowing. No evidence of aneurysm, dissection or vasculitis. Convincing features fibromuscular dysplasia. IMA: Patent without evidence of aneurysm, dissection, vasculitis or significant stenosis. Inflow: Calcified atheromatous plaque throughout the proximal inflow vasculature.  No evidence of aneurysm, dissection or vasculitis. Proximal Outflow: Bilateral common femoral and visualized portions of the superficial and profunda femoral arteries are minimally calcified but otherwise patent without evidence of aneurysm, dissection, vasculitis or significant stenosis. Veins: Flattened appearance of the IVC may reflect hypovolemic state. No major venous abnormality or large venous occlusions. Portal and hepatic veins appear patent Review of the MIP images confirms the above findings. NON-VASCULAR Lower chest: Subpleural reticular changes are favored to be atelectatic in nature. Normal heart size. No pericardial effusion. Hepatobiliary: Few subcentimeter hypoattenuating foci in the liver too small to fully characterize on CT imaging but statistically likely benign. No concerning focal liver lesion. Slight prominence of the biliary tree is likely within the age-appropriate normal. Physiologic distension of the gallbladder without pericholecystic fluid or inflammation. No visible calcified gallstones. Pancreas: Mild pancreatic atrophy. No pancreatic ductal dilatation or surrounding inflammatory changes. Spleen: Normal spleen. Adrenals/Urinary Tract: Normal adrenal glands. A genius Lea enhancing mixed solid and cystic lesion arising from the upper pole right kidney measuring up to 3.7 x 3.5 x 3.5 cm in size (7/19)  concerning for a solid renal neoplasm, favor RCC. Additional fluid attenuation cysts bilaterally. Bilateral extrarenal pelves. No obstructive urolithiasis or hydronephrosis. Urinary bladder is unremarkable for the degree of distention. Stomach/Bowel: Large patulous fluid-filled hiatal hernia. Distal stomach and duodenum are unremarkable. No small bowel thickening or dilatation. A normal appendix is visualized. Proximal colonic segments are unremarkable. There is focal segmental thickening of the rectosigmoid colon with surrounding air likely contained either within the bowel wall or mesocolon and tracking into the retroperitoneal tissue planes. Surrounding inflammatory changes and small volume of retroperitoneal free fluid as well. No resulting obstruction. No convincing sites of active contrast extravasation or accumulation within the bowel Lymphatic: No suspicious or enlarged lymph nodes in the included lymphatic chains. Reproductive: Uterus is surgically absent. No concerning adnexal lesions. Other: The free air and fluid likely contained into the mesial colonic planes and retroperitoneum. No large bowel containing hernia. Musculoskeletal: The osseous structures appear diffusely demineralized which may limit detection of small or nondisplaced fractures. Grade 1 anterolisthesis L4 on 5 without spondylolysis. Multilevel discogenic and facet degenerative changes throughout the included spine. Additional degenerative changes in the hips and pelvis as well as in the hands and wrist partially included within the level of this exam. No acute osseous abnormality or suspicious osseous lesion. IMPRESSION: Vascular: 1. No acute sites of active contrast extravasation or accumulation of contrast within the bowel to suggest active arterial site of bleeding. 2. Air surrounding the aorta and inflow vasculature is likely related to the colorectal process detailed below. No features of acute aortitis or other acute aortic  abnormality is seen at this time. 3.  Aortic Atherosclerosis (ICD10-I70.0). 4. Moderate ostial plaque narrowing of the bilateral renal arteries. 5. Mild plaque narrowing the ostium of the celiac axis and SMA. 6. 7 mm splenic artery aneurysm. 7. Flattened appearance of the IVC, correlate with hydration status. Nonvascular: 1. Thickened rectosigmoid colon with pneumatosis and extraluminal gas and free fluid largely confined within the planes of the mesocolon and retroperitoneum. Concerning for perforation and possible bowel wall necrosis. Urgent surgical consultation is warranted. 2. 3.7 cm heterogeneously enhancing solid renal mass in the upper pole right kidney, highly worrisome for renal cell carcinoma. 3. Large hiatal hernia. These results were called by telephone at the time of interpretation on 06/21/2020 at 4:27 am to provider [facility] , who verbally acknowledged these results. Electronically Signed   By:  Kreg Shropshire M.D.   On: 06/21/2020 04:28     Assessment/Plan Principal Problem:   Perforated bowel (HCC) Active Problems:   Adult hypothyroidism   Late onset Alzheimer's disease with behavioral disturbance (HCC)   Rectal bleeding     Perforated bowel with rectal bleeding Patient was sent to the emergency room for evaluation of rectal bleeding and imaging showed findings concerning for perforation and bowel wall necrosis. Surgery was consulted in the ER and conversation with the family, the plan is for patient to be placed on comfort measures with no further aggressive intervention. Will administer morphine as needed for pain control Haldol/Ativan for agitation Family is at the bedside and in agreement with the plan.  DVT prophylaxis: SCD Code Status: DO NOT RESUSCITATE Family Communication: Greater than 50% of time was spent discussing patient's condition and plan of care with her husband and son-in-law at the bedside, all questions and concerns have been addressed.  They verbalized  understanding and agree with the plan Disposition Plan: Back to previous home environment Consults called: none Status: Inpatient    Tatijana Bierly MD [facility]     06/21/2020, 8:41 AM

## 2020-06-21 NOTE — ED Notes (Signed)
Transport called to take patient to floor.

## 2020-06-21 NOTE — ED Notes (Signed)
Family updated as to patient's status.

## 2020-06-21 NOTE — Sepsis Progress Note (Signed)
Notified provider of need to order repeat lactic acid. ° °

## 2020-06-21 NOTE — Progress Notes (Signed)
Patient had increase in morphine from 1mg  to 2mg  due to PAIN-AD 7. Patient agitated, throwing sheets off bed, talking about leaving saying she needs to get in her car, disoriented and confused. She is reassured by family at bedside and redirectable but continues to need frequent redirection. Morphine 2mg  had no effect so MD contacted and notified of agitation. Received order for Ativan 2mg  q4. First dose given and effective within 10 minutes evidenced by patient laying in bed with eyes closed, respirations even and non-labored. Family states they will take shifts and there will always be someone at bedside.

## 2020-06-21 NOTE — Progress Notes (Signed)
RN asked about hospice home for this patient. CSW asked MD to place orders for Palliative to discuss with family if MD feels this is appropriate.  Alfonso Ramus, [place] [phone]

## 2020-06-21 NOTE — Progress Notes (Signed)
   06/21/20 1050  Clinical Encounter Type  Visited With Patient and family together  Visit Type Follow-up  Referral From Nurse  Consult/Referral To Chaplain  Spiritual Encounters  Spiritual Needs Prayer;Emotional  Chaplain Oleta Mouse responded to an OR in room 2C-218A, Pt Katelyn Baker. Husband and daughter by the Pt's bedside. I provided reflective listening, emotional and spiritual support and prayer was offered and received at the ending of the visit.

## 2020-06-21 NOTE — Progress Notes (Signed)
CODE SEPSIS - PHARMACY COMMUNICATION  **Broad Spectrum Antibiotics should be administered within 1 hour of Sepsis diagnosis**  Time Code Sepsis Called/Page Received:  4/30 @ 0007  Antibiotics Ordered: Cefepime 2 gm IV X 1   Time of 1st antibiotic administration: 4/30 @ 0040  Additional action taken by pharmacy:   If necessary, Name of Provider/Nurse Contacted:     Kaylyne Axton D ,PharmD Clinical Pharmacist  06/21/2020  1:22 AM

## 2020-06-21 NOTE — Progress Notes (Signed)
PHARMACY -  BRIEF ANTIBIOTIC NOTE   Pharmacy has received consult(s) for Cefepime from an ED provider.  The patient's profile has been reviewed for ht/wt/allergies/indication/available labs.    One time order(s) placed for Cefepime 2 gm IV X 1  Further antibiotics/pharmacy consults should be ordered by admitting physician if indicated.                       Thank you, Story Vanvranken D 06/21/2020  12:25 AM

## 2020-06-21 NOTE — ED Notes (Signed)
Transport at bedside to take pt to floor.

## 2020-06-21 NOTE — Consult Note (Signed)
SURGICAL CONSULTATION NOTE   HISTORY OF PRESENT ILLNESS (HPI):  75 y.o. female presented to [facility] ED for evaluation of rectal bleeding.  Patient with baseline dementia unable to give a history.  Son-in-law at bedside.  He endorses that patient started having rectal bleeding.  Yesterday, and her facility she was seen with abundant amount of blood in her clothes and bathroom.  Unable to say if patient was having abdominal pain and onset of the abdominal pain at the moment.  Also difficult to say about pain radiation or alleviating or aggravating factors due to patient dementia.  At the ED she was found with leukocytosis and lactic acidosis.  CT scan of the abdomen and pelvis was done that it shows severe constipation with rectal perforation retroperitoneally.  I personally evaluated the images.  Patient has been with stable vital signs.  Surgery is consulted by Dr. York Cerise in this context for evaluation and management of rectal perforation.  PAST MEDICAL HISTORY (PMH):  Past Medical History:  Diagnosis Date  . GERD (gastroesophageal reflux disease)   . Hyperlipidemia   . Thyroid disease      PAST SURGICAL HISTORY (PSH):  Past Surgical History:  Procedure Laterality Date  . ABDOMINAL HYSTERECTOMY     partial  . bladder tact    . BREAST BIOPSY Left 1980'S   EXCISIONAL - NEG  . BREAST SURGERY  2000's   biopsy  . THYROIDECTOMY, PARTIAL  1990's     MEDICATIONS:  Prior to Admission medications   Medication Sig Start Date End Date Taking? Authorizing Provider  buPROPion (WELLBUTRIN XL) 150 MG 24 hr tablet Take 1 tablet (150 mg total) by mouth daily. 04/16/19  Yes [name], PA-C  Calcium Carbonate (CALCIUM 600 PO) Take 600 mg by mouth daily.    Yes [provider]  levothyroxine (SYNTHROID) 112 MCG tablet Take 112 mcg by mouth daily before breakfast.   Yes [provider]  lisinopril (ZESTRIL) 10 MG tablet Take 1 tablet (10 mg total) by mouth daily. 04/16/19   Yes [name], PA-C  LORazepam (ATIVAN) 0.5 MG tablet Take 0.5 mg by mouth 2 (two) times daily as needed for sedation.   Yes [provider]  Multiple Vitamin (MULTIVITAMIN) tablet Take 1 tablet by mouth daily.   Yes [provider]  Multiple Vitamins-Minerals (MULTIVITAMIN WITH MINERALS) tablet Take 1 tablet by mouth daily.   Yes [provider]  QUEtiapine (SEROQUEL) 25 MG tablet Take 25 mg by mouth 3 (three) times daily. (take with 50mg  tablet to equal 75mg  total)   Yes [provider]  QUEtiapine (SEROQUEL) 50 MG tablet Take 50 mg by mouth 3 (three) times daily. (take with 25mg  tablet to equal 75mg  total)   Yes [provider]  traZODone (DESYREL) 50 MG tablet Take 50 mg by mouth daily in the afternoon.   Yes [provider]  levothyroxine (SYNTHROID) 100 MCG tablet Take 1 tablet (100 mcg total) by mouth daily. Patient not taking: No sig reported 04/16/19   , PA-C  Memantine HCl-Donepezil HCl [facility]) 7-10 MG CP24 Take 1 capsule by mouth at bedtime. Patient not taking: No sig reported 10/05/19   04/18/19, PA-C     ALLERGIES:  Allergies  Allergen Reactions  . Bactrim [Sulfamethoxazole-Trimethoprim] Rash     SOCIAL HISTORY:  Social History   Socioeconomic History  . Marital status: Married    Spouse name: [name]  . Number of children: 2  . Years of  education: 38  . Highest education level: Some college, no degree  Occupational History  . Occupation: Retired  Tobacco Use  . Smoking status: Passive Smoke Exposure - Never Smoker  . Smokeless tobacco: Never Used  Vaping Use  . Vaping Use: Never used  Substance and Sexual Activity  . Alcohol use: No  . Drug use: No  . Sexual activity: Not Currently  Other Topics Concern  . Not on file  Social History Narrative  . Not on file   Social Determinants of Health   Financial Resource Strain: Not on file  Food Insecurity: Not on file   Transportation Needs: Not on file  Physical Activity: Not on file  Stress: Not on file  Social Connections: Not on file  Intimate Partner Violence: Not on file      FAMILY HISTORY:  Family History  Problem Relation Age of Onset  . Alzheimer's disease Mother   . Diabetes Mother        ?  Marland Kitchen Alcohol abuse Father   . Cancer Father        unknown cancer  . Diabetes Other   . Heart disease Other   . Cancer Other   . Colon polyps Other   . Breast cancer Neg Hx      REVIEW OF SYSTEMS:  Constitutional: denies weight loss, fever, chills, or sweats  Eyes: denies any other vision changes, history of eye injury  ENT: denies sore throat, hearing problems  Respiratory: denies shortness of breath, wheezing  Cardiovascular: denies chest pain, palpitations  Gastrointestinal: Positive abdominal pain, rectal bleeding Genitourinary: denies burning with urination or urinary frequency Musculoskeletal: denies any other joint pains or cramps  Skin: denies any other rashes or skin discolorations  Neurological: denies any other headache, dizziness, positive for weakness  Psychiatric: Positive for dementia  All other review of systems were negative   VITAL SIGNS:  Pulse Rate:  [52-116] 105 (04/30 0630) Resp:  [10-27] 16 (04/30 0630) BP: (92-155)/(60-126) 145/78 (04/30 0630) SpO2:  [96 %-100 %] 99 % (04/30 0630) Weight:  [63.5 kg] 63.5 kg (04/29 2328)     Height: 5\' 3"  (160 cm) Weight: 63.5 kg BMI (Calculated): 24.8   INTAKE/OUTPUT:  This shift: Total I/O In: -  Out: 800 [Urine:800]  Last 2 shifts: @IOLAST2SHIFTS @   PHYSICAL EXAM:  Constitutional:  -- Normal body habitus  -- Somnolent, patient received multiple pain medications Eyes:  -- Pupils equally round and reactive to light  -- No scleral icterus  Ear, nose, and throat:  -- No jugular venous distension  Pulmonary:  -- No crackles  -- Equal breath sounds bilaterally -- Breathing non-labored at rest Cardiovascular:  -- S1,  S2 present  -- No pericardial rubs Gastrointestinal:  -- Abdomen soft, tender in all quadrants, distended, with guarding  Musculoskeletal and Integumentary:  -- Wounds: None appreciated -- Extremities: B/L UE and LE FROM, hands and feet warm, no edema  Neurologic:  -- Motor function: intact and symmetric -- Sensation: intact and symmetric   Labs:  CBC Latest Ref Rng & Units 06/20/2020 01/19/2020 04/05/2019  WBC 4.0 - 10.5 K/uL 17.8(H) 6.2 5.6  Hemoglobin 12.0 - 15.0 g/dL 01/21/2020 06/03/2019 60.1  Hematocrit 36.0 - 46.0 % 39.1 38.8 38.2  Platelets 150 - 400 K/uL 169 157 151   CMP Latest Ref Rng & Units 06/20/2020 01/19/2020 04/05/2019  Glucose 70 - 99 mg/dL 01/21/2020) 96 94  BUN 8 - 23 mg/dL 20 11 12   Creatinine 0.44 - 1.00 mg/dL  0.86 0.87 1.14(H)  Sodium 135 - 145 mmol/L 139 140 139  Potassium 3.5 - 5.1 mmol/L 3.8 3.9 3.4(L)  Chloride 98 - 111 mmol/L 106 103 100  CO2 22 - 32 mmol/L 24 26 26   Calcium 8.9 - 10.3 mg/dL 8.9 9.4 8.9  Total Protein 6.5 - 8.1 g/dL 6.3(L) - 6.6  Total Bilirubin 0.3 - 1.2 mg/dL 0.9 - 0.4  Alkaline Phos 38 - 126 U/L 49 - 73  AST 15 - 41 U/L 23 - 36  ALT 0 - 44 U/L 19 - 33(H)    Imaging studies:  EXAM: CTA ABDOMEN AND PELVIS WITHOUT AND WITH CONTRAST  TECHNIQUE: Multidetector CT imaging of the abdomen and pelvis was performed using the standard protocol during bolus administration of intravenous contrast. Multiplanar reconstructed images and MIPs were obtained and reviewed to evaluate the vascular anatomy.  CONTRAST:  OMNIPAQUE IOHEXOL 350 MG/ML SOLN  COMPARISON:  CT abdomen pelvis 12/03/2014  FINDINGS: VASCULAR  Aorta: Scattered calcified and noncalcified atheromatous plaque is present within the normal caliber abdominal aorta. No acute luminal abnormality. No abnormal vessel wall thickening. Extensive retroperitoneal free air is present, favored to be related to the patient's colorectal process detailed below.  Celiac: Mild ostial plaque  narrowing with additional plaque seen throughout the branches, particularly with the splenic artery with a small partially calcified 7 mm splenic artery aneurysm (7/17). No other acute or significant vascular abnormality. No hemodynamically significant stenosis or occlusion.  SMA: Mild ostial plaque narrowing. No evidence of aneurysm, dissection or vasculitis. No other significant stenosis or occlusion.  Renals: Moderate bilateral ostial plaque narrowing. No evidence of aneurysm, dissection or vasculitis. Convincing features fibromuscular dysplasia.  IMA: Patent without evidence of aneurysm, dissection, vasculitis or significant stenosis.  Inflow: Calcified atheromatous plaque throughout the proximal inflow vasculature. No evidence of aneurysm, dissection or vasculitis.  Proximal Outflow: Bilateral common femoral and visualized portions of the superficial and profunda femoral arteries are minimally calcified but otherwise patent without evidence of aneurysm, dissection, vasculitis or significant stenosis.  Veins: Flattened appearance of the IVC may reflect hypovolemic state. No major venous abnormality or large venous occlusions. Portal and hepatic veins appear patent  Review of the MIP images confirms the above findings.  NON-VASCULAR  Lower chest: Subpleural reticular changes are favored to be atelectatic in nature. Normal heart size. No pericardial effusion.  Hepatobiliary: Few subcentimeter hypoattenuating foci in the liver too small to fully characterize on CT imaging but statistically likely benign. No concerning focal liver lesion. Slight prominence of the biliary tree is likely within the age-appropriate normal. Physiologic distension of the gallbladder without pericholecystic fluid or inflammation. No visible calcified gallstones.  Pancreas: Mild pancreatic atrophy. No pancreatic ductal dilatation or surrounding inflammatory changes.  Spleen: Normal  spleen.  Adrenals/Urinary Tract: Normal adrenal glands. A genius Lea enhancing mixed solid and cystic lesion arising from the upper pole right kidney measuring up to 3.7 x 3.5 x 3.5 cm in size (7/19) concerning for a solid renal neoplasm, favor RCC. Additional fluid attenuation cysts bilaterally. Bilateral extrarenal pelves. No obstructive urolithiasis or hydronephrosis. Urinary bladder is unremarkable for the degree of distention.  Stomach/Bowel: Large patulous fluid-filled hiatal hernia. Distal stomach and duodenum are unremarkable. No small bowel thickening or dilatation. A normal appendix is visualized. Proximal colonic segments are unremarkable. There is focal segmental thickening of the rectosigmoid colon with surrounding air likely contained either within the bowel wall or mesocolon and tracking into the retroperitoneal tissue planes. Surrounding inflammatory changes and small  volume of retroperitoneal free fluid as well. No resulting obstruction. No convincing sites of active contrast extravasation or accumulation within the bowel  Lymphatic: No suspicious or enlarged lymph nodes in the included lymphatic chains.  Reproductive: Uterus is surgically absent. No concerning adnexal lesions.  Other: The free air and fluid likely contained into the mesial colonic planes and retroperitoneum. No large bowel containing hernia.  Musculoskeletal: The osseous structures appear diffusely demineralized which may limit detection of small or nondisplaced fractures. Grade 1 anterolisthesis L4 on 5 without spondylolysis. Multilevel discogenic and facet degenerative changes throughout the included spine. Additional degenerative changes in the hips and pelvis as well as in the hands and wrist partially included within the level of this exam. No acute osseous abnormality or suspicious osseous lesion.  IMPRESSION: Vascular:  1. No acute sites of active contrast extravasation or  accumulation of contrast within the bowel to suggest active arterial site of bleeding. 2. Air surrounding the aorta and inflow vasculature is likely related to the colorectal process detailed below. No features of acute aortitis or other acute aortic abnormality is seen at this time. 3.  Aortic Atherosclerosis (ICD10-I70.0). 4. Moderate ostial plaque narrowing of the bilateral renal arteries. 5. Mild plaque narrowing the ostium of the celiac axis and SMA. 6. 7 mm splenic artery aneurysm. 7. Flattened appearance of the IVC, correlate with hydration status.  Nonvascular:  1. Thickened rectosigmoid colon with pneumatosis and extraluminal gas and free fluid largely confined within the planes of the mesocolon and retroperitoneum. Concerning for perforation and possible bowel wall necrosis. Urgent surgical consultation is warranted. 2. 3.7 cm heterogeneously enhancing solid renal mass in the upper pole right kidney, highly worrisome for renal cell carcinoma. 3. Large hiatal hernia.  These results were called by telephone at the time of interpretation on 06/21/2020 at 4:27 am to provider [facility] , who verbally acknowledged these results.   Electronically Signed   By: Kreg Shropshire M.D.   On: 06/21/2020 04:28  Assessment/Plan:  75 y.o. female with rectal perforation due to severe constipation and stercoral colitis, complicated by pertinent comorbidities including advanced dementia.  Patient with about dementia living in a memory facility.  Due to his severe constipation most likely the rectum develop ischemia and perforated posteriorly.  She has a wound amount of pneumatosis in the rectum and free air tracking throughout her retroperitoneum.  This is a very unfortunate situation.  This finding on the 75 year old with advanced dementia is very critical.  I initially discussed the case with the son-in-law who was present at 4:30 in the morning bedside and with her daughter on the  phone since she was out of town about the severity of the situation and the alternatives from being aggressive undergoing exploratory laparotomy versus observation and comfort care.  It was decided to wait for the husband to come and have that discussion.  I went back at 6 in the morning to discuss with the husband, granddaughter and son-in-law about the situation.  This patient with retroperitoneal perforation of the rectum with at least need an exploratory laparotomy with partial colectomy of the sigmoid and rectum and an end colostomy and debridement of the retroperitoneum.  Usually retroperitoneal infection are very hard to contain due to the extension of the infection through the planes of the retroperitoneum and the difficulty and risk of debriding and irrigating the whole retroperitoneum.  I discussed with the family the high chances of developing septic shock even after surgery and the need of mechanical  ventilation without a specific timeframe.  I discussed that the goal of that will be to extend her life but will not guarantee any improvement in her quality of life.  They were understanding of the situation and they are considering in 1 to discuss with hospice care I will do alternative of comfort measures.  I think that this is very reasonable alternative for this patient.  I will be available if there is any question or anything else that I can assist in the management of this patient.  I discussed this case with ED physician who will contact hospitalist for further evaluation and management.  I spent more than 90 minutes in this complicated case most of the time counseling the family and coordinating plan of care.  Gae GallopEdgardo Cintrn-Daz, MD

## 2020-06-21 NOTE — Plan of Care (Signed)
PRN Haldol and Morphine effective in managing pain and agitation

## 2020-06-21 NOTE — Sepsis Progress Note (Signed)
Notified bedside nurse of need to draw repeat lactic acid. 

## 2020-06-21 NOTE — Sepsis Progress Note (Signed)
Following for Code Sepsis  

## 2020-06-22 LAB — URINE CULTURE
Culture: NO GROWTH
Special Requests: NORMAL

## 2020-06-22 NOTE — Progress Notes (Signed)
Patient is admitted to the hospital found to have bowel perforation, family elected comfort measures, patient is unresponsive, does not appear in distress, family at bedside, updated.  Possible in-hospital death.

## 2020-06-22 NOTE — Progress Notes (Addendum)
Patient has had two episodes of severe agitation/pain when she moans, grimaces, pulls at sheets, brings knees to chest, family unable to console. Symptoms responded well in both episodes to combination of morphine and ativan IV (see MAR/flowsheets). Within minutes of medication during both episodes patient was resting quietly with eyes closed, respirations even and non-labored; able to tolerate bath, personal care, and repositioning without any signs of distress or pain. Patient has not had any oral intake for at least the 2 days she has been here. Skin is pale but warm, no signs of mottling, no upper airway congestion. Family has been at bedside throughout the process, taking shifts, with the patient's daughter French Ana making healthcare decisions. Education provided to family throughout the day about the dying process, emotional support given, questions answered. Family is very appreciative of the care patient is receiving here. They are currently awaiting palliative care consult and wish to have patient transferred to [facility] in [place].

## 2020-06-23 DIAGNOSIS — K631 Perforation of intestine (nontraumatic): Secondary | ICD-10-CM | POA: Diagnosis not present

## 2020-06-23 NOTE — Progress Notes (Addendum)
[facility] Room 218 [facility]) Hospital Liaison RN note:  Received request from Dr. Allena Katz and Bevelyn Ngo, [facility] for family interest in [facility]. Chart reviewed and eligibility was approved. Spoke with daughter, French Ana over the phone to confirm interest and explain services. She verbalized understanding and had no questions. Unfortunately, Hospice Home is not able to offer a room today. Hospital care team is aware. [facility] Liaison will follow for room availability.  Please call with any hospice related questions or concerns.  Thank you for the opportunity to participate in this patient's care.  Cyndra Numbers, RN [facility] Liaison [phone]

## 2020-06-23 NOTE — TOC Progression Note (Signed)
Transition of Care [facility]) - Progression Note    Patient Details  Name: Katelyn Baker MRN: [id number] Date of Birth: 07-Jul-1945  Transition of Care [facility]) CM/SW Contact  Chapman Fitch, RN Phone Number: 06/23/2020, 12:00 PM  Clinical Narrative:      MD has made referral to [facility] with Civil engineer, contracting for [facility].  No beds available today      Expected Discharge Plan and Services                                                 Social Determinants of Health (SDOH) Interventions    Readmission Risk Interventions No flowsheet data found.

## 2020-06-23 NOTE — Progress Notes (Signed)
[facility]  - [place] at [facility]   PATIENT NAME: Katelyn Baker    MR#:  [id number]  DATE OF BIRTH:  [birth year]  SUBJECTIVE:   Patient is under comfort care. Daughter French Ana and husband in the room. Appears comfortable. REVIEW OF SYSTEMS:   Review of Systems  Unable to perform ROS: Other  pt under comfort care  DRUG ALLERGIES:   Allergies  Allergen Reactions  . Bactrim [Sulfamethoxazole-Trimethoprim] Rash    VITALS:  Blood pressure 101/67, pulse 93, temperature 98.3 F (36.8 C), resp. rate 17, height 5\' 3"  (1.6 m), weight 63.5 kg, SpO2 96 %.  PHYSICAL EXAMINATION:   Physical Exam  GENERAL:  75 y.o.-year-old patient lying in the bed with no acute distress.  CARDIOVASCULAR: S1, S2 normal. No murmurs, rubs, or gallops.  EXTREMITIES: No cyanosis, clubbing or edema b/l.    NEUROLOGIC: unresponsive PSYCHIATRIC:  patient is lethargic  LABORATORY PANEL:  CBC Recent Labs  Lab 06/20/20 2334  WBC 17.8*  HGB 13.1  HCT 39.1  PLT 169    Chemistries  Recent Labs  Lab 06/20/20 2334  NA 139  K 3.8  CL 106  CO2 24  GLUCOSE 169*  BUN 20  CREATININE 0.86  CALCIUM 8.9  AST 23  ALT 19  ALKPHOS 49  BILITOT 0.9   Cardiac Enzymes No results for input(s): TROPONINI in the last 168 hours. RADIOLOGY:  No results found. ASSESSMENT AND PLAN:  Katelyn Baker is a 75 y.o. female with medical history significant for advanced dementia, hypothyroidism and GERD who was brought into the ER from [facility] memory care unit for evaluation of rectal bleeding. CT done in the emergency room showed  thickened rectosigmoid colon with pneumatosis and extraluminal gas and free fluid largely confined within the planes of the mesocolon and retroperitoneum. Concerning for perforation and possible bowel wall necrosis  Perforated bowel with rectal bleeding Patient was sent to the emergency room for evaluation of rectal bleeding and imaging showed findings concerning for  perforation and bowel wall necrosis. Surgery was consulted in the ER and conversation with the family, the plan is for patient to be placed on comfort measures with no further aggressive intervention. Will administer morphine as needed for pain control Haldol/Ativan for agitation Family is at the bedside and in agreement with transitioning patient to hospice facility when bed available.  Continue comfort care measures  Family communication : daughter [facility] and husband at bedside CODE STATUS: DNR DVT Prophylaxis : comfort care Level of care: Med-Surg Status is: Inpatient  Remains inpatient appropriate because:Unsafe d/c plan   Dispo: The patient is from: [facility] assisted living              Anticipated d/c is to: hospice facility once bed available      TOTAL TIME TAKING CARE OF THIS PATIENT: 15 minutes.  >50% time spent on counselling and coordination of care  Note: This dictation was prepared with Dragon dictation along with smaller phrase technology. Any transcriptional errors that result from this process are unintentional.  [facility] M.D    [facility]   CC: Primary care physician; Enedina Finner, PAPatient ID: Katelyn Baker, female   DOB: Sep 14, 1945, 75 y.o.   MRN: [id number]

## 2020-06-24 DIAGNOSIS — K631 Perforation of intestine (nontraumatic): Secondary | ICD-10-CM | POA: Diagnosis not present

## 2020-06-24 NOTE — Progress Notes (Signed)
[facility] Room 218 [facility]) Hospital Liaison RN note:  Unfortunately, Hospice Home does not have a room to offer today. Hospital care team is aware. Spoke with daughter, French Ana over the phone to provide update. [facility] Liaison will continue to follow for room availability.  Please call with any hospice related questions or concerns.  Thank you for the opportunity to participate in this patient's care.  Cyndra Numbers, RN [facility] Liaison [phone]

## 2020-06-24 NOTE — Progress Notes (Addendum)
[facility]  - [place] at [facility]   PATIENT NAME: Katelyn Baker    MR#:  [id number]  DATE OF BIRTH:  09/23/1945  SUBJECTIVE:   Patient is under comfort care. Daughter French Ana  in the room. Appears comfortable. REVIEW OF SYSTEMS:   Review of Systems  Unable to perform ROS: Other  pt under comfort care  DRUG ALLERGIES:   Allergies  Allergen Reactions  . Bactrim [Sulfamethoxazole-Trimethoprim] Rash    VITALS:  Blood pressure 101/67, pulse 93, temperature 98.3 F (36.8 C), resp. rate 17, height 5\' 3"  (1.6 m), weight 63.5 kg, SpO2 96 %.  PHYSICAL EXAMINATION:   Physical Exam  GENERAL:  75 y.o.-year-old patient lying in the bed with no acute distress.  CARDIOVASCULAR: S1, S2 normal. No murmurs, rubs, or gallops.  EXTREMITIES: No cyanosis, clubbing or edema b/l.    NEUROLOGIC: unresponsive PSYCHIATRIC:  patient is lethargic  LABORATORY PANEL:  CBC Recent Labs  Lab 06/20/20 2334  WBC 17.8*  HGB 13.1  HCT 39.1  PLT 169    Chemistries  Recent Labs  Lab 06/20/20 2334  NA 139  K 3.8  CL 106  CO2 24  GLUCOSE 169*  BUN 20  CREATININE 0.86  CALCIUM 8.9  AST 23  ALT 19  ALKPHOS 49  BILITOT 0.9   Cardiac Enzymes No results for input(s): TROPONINI in the last 168 hours. RADIOLOGY:  No results found. ASSESSMENT AND PLAN:  Katelyn Baker is a 75 y.o. female with medical history significant for advanced dementia, hypothyroidism and GERD who was brought into the ER from [facility] memory care unit for evaluation of rectal bleeding. CT done in the emergency room showed  thickened rectosigmoid colon with pneumatosis and extraluminal gas and free fluid largely confined within the planes of the mesocolon and retroperitoneum. Concerning for perforation and possible bowel wall necrosis  Perforated bowel with rectal bleeding Severe Sepsis POA Patient was sent to the emergency room for evaluation of rectal bleeding and imaging showed findings  concerning for perforation and bowel wall necrosis. Surgery was consulted in the ER and conversation with the family, the plan is for patient to be placed on comfort measures with no further aggressive intervention. -- Patient presented with tachycardia elevated white count of 17,000 lactic acid more than 2.3 along with perforation of the bowel. Patient is not being treated since she is comfort care since admission. -- Patient was started on IV fluids and surgery had long discussion with family in the ER and decided towards comfort care. --IV morphine as needed for pain control --Haldol/Ativan for agitation --Family is at the bedside and in agreement with transitioning patient to hospice facility when bed available.   Continue comfort care measures  Family communication : daughter [facility]  at bedside CODE STATUS: DNR DVT Prophylaxis : comfort care Level of care: Med-Surg Status is: Inpatient  Remains inpatient appropriate because:waiting for hospice bed to open up   Dispo: The patient is from: [facility] assisted living              Anticipated d/c is to: hospice facility once bed available      TOTAL TIME TAKING CARE OF THIS PATIENT: 15 minutes.  >50% time spent on counselling and coordination of care  Note: This dictation was prepared with Dragon dictation along with smaller phrase technology. Any transcriptional errors that result from this process are unintentional.  [facility] M.D    [facility]   CC: Primary care physician; Enedina Finner,  Onalee Hua, PAPatient ID: Katelyn Baker, female   DOB: 03/12/1945, 75 y.o.   MRN: [id number]

## 2020-06-24 NOTE — Care Management Important Message (Signed)
Important Message  Patient Details  Name: Katelyn Baker MRN: [id number] Date of Birth: 1945-07-09   Medicare Important Message Given:  Other (see comment)  On comfort care measures with plan to [facility] once bed available.  Medicare IM withheld at this time out of respect for patient and family.     Johnell Comings 06/24/2020, 8:18 AM

## 2020-06-25 DIAGNOSIS — K631 Perforation of intestine (nontraumatic): Secondary | ICD-10-CM | POA: Diagnosis not present

## 2020-06-25 NOTE — Progress Notes (Signed)
[facility] Room 218 [facility]) Hospital Liaison RN note:  Visited with patient in room. Spoke with spouse, John at bedside. Unfortunately, [facility] is not able to offer a room today. Hospital care team is aware. [facility] Liaison will continue to follow for room availability.  Please call with any hospice related questions or concerns.  Cyndra Numbers, RN [facility] Liaison  [phone]

## 2020-06-25 NOTE — Progress Notes (Signed)
[facility]  - [place] at [facility]   PATIENT NAME: Katelyn Baker    MR#:  [id number]  DATE OF BIRTH:  [birth year]  SUBJECTIVE:   Patient is under comfort care.  Husband in the room. Appears comfortable. REVIEW OF SYSTEMS:   Review of Systems  Unable to perform ROS: Other  pt under comfort care  DRUG ALLERGIES:   Allergies  Allergen Reactions  . Bactrim [Sulfamethoxazole-Trimethoprim] Rash    VITALS:  Blood pressure 101/67, pulse 93, temperature 98.3 F (36.8 C), resp. rate 17, height 5\' 3"  (1.6 m), weight 63.5 kg, SpO2 96 %.  PHYSICAL EXAMINATION:   Physical Exam  GENERAL:  75 y.o.-year-old patient lying in the bed with no acute distress.  CARDIOVASCULAR: S1, S2 normal. No murmurs, rubs, or gallops.    NEUROLOGIC: unresponsive   LABORATORY PANEL:  CBC Recent Labs  Lab 06/20/20 2334  WBC 17.8*  HGB 13.1  HCT 39.1  PLT 169    Chemistries  Recent Labs  Lab 06/20/20 2334  NA 139  K 3.8  CL 106  CO2 24  GLUCOSE 169*  BUN 20  CREATININE 0.86  CALCIUM 8.9  AST 23  ALT 19  ALKPHOS 49  BILITOT 0.9   Cardiac Enzymes No results for input(s): TROPONINI in the last 168 hours. RADIOLOGY:  No results found. ASSESSMENT AND PLAN:  Katelyn Baker is a 75 y.o. female with medical history significant for advanced dementia, hypothyroidism and GERD who was brought into the ER from [facility] memory care unit for evaluation of rectal bleeding. CT done in the emergency room showed  thickened rectosigmoid colon with pneumatosis and extraluminal gas and free fluid largely confined within the planes of the mesocolon and retroperitoneum. Concerning for perforation and possible bowel wall necrosis  Perforated bowel with rectal bleeding Severe Sepsis POA Patient was sent to the emergency room for evaluation of rectal bleeding and imaging showed findings concerning for perforation and bowel wall necrosis. Surgery was consulted in the ER and  conversation with the family, the plan is for patient to be placed on comfort measures with no further aggressive intervention. -- Patient presented with tachycardia elevated white count of 17,000 lactic acid more than 2.3 along with perforation of the bowel. Patient is not being treated since she is comfort care since admission. -- Patient was started on IV fluids and surgery had long discussion with family in the ER and decided towards comfort care. --IV morphine as needed for pain control --Haldol/Ativan for agitation --Family is at the bedside and in agreement with transitioning patient to hospice facility when bed available.   Continue comfort care measures  Family communication : husband at bedside CODE STATUS: DNR DVT Prophylaxis : comfort care Level of care: Med-Surg Status is: Inpatient  Remains inpatient appropriate because:waiting for hospice bed to open up   Dispo: The patient is from: [facility] assisted living              Anticipated d/c is to: hospice facility once bed available      TOTAL TIME TAKING CARE OF THIS PATIENT: 15 minutes.  >50% time spent on counselling and coordination of care  Note: This dictation was prepared with Dragon dictation along with smaller phrase technology. Any transcriptional errors that result from this process are unintentional.  [facility] M.D    [facility]   CC: Primary care physician; Enedina Finner, PAPatient ID: Katelyn Baker, female   DOB: September 11, 1945, 75 y.o.  MRN: [id number]

## 2020-06-25 NOTE — Progress Notes (Signed)
Pt resting comfortable. Niece at bedside.

## 2020-06-25 NOTE — TOC Progression Note (Signed)
Transition of Care [facility]) - Progression Note    Patient Details  Name: Katelyn Baker MRN: [id number] Date of Birth: 12-05-1945  Transition of Care [facility]) CM/SW Contact  Chapman Fitch, RN Phone Number: 06/25/2020, 9:11 AM  Clinical Narrative:    Per Boyd Kerbs with [facility] no bed available at this time        Expected Discharge Plan and Services                                                 Social Determinants of Health (SDOH) Interventions    Readmission Risk Interventions No flowsheet data found.

## 2020-06-25 NOTE — Plan of Care (Signed)

## 2020-06-26 DIAGNOSIS — K631 Perforation of intestine (nontraumatic): Secondary | ICD-10-CM | POA: Diagnosis not present

## 2020-06-26 LAB — CULTURE, BLOOD (ROUTINE X 2)
Culture: NO GROWTH
Culture: NO GROWTH
Special Requests: ADEQUATE

## 2020-06-26 NOTE — Progress Notes (Signed)
[facility] Room 218 [facility]) Hospital Liaison RN note:  Visited patient at bedside. Spoke with spouse, Jonny Ruiz in the room. Unfortunately, [facility] is not able to offer a room today. Hospital care team is aware. [facility] Liaison will continue to follow for room availability.   Please call with any hospice related questions or concerns.  Cyndra Numbers, RN [facility] Liaison  [phone]

## 2020-06-26 NOTE — Progress Notes (Signed)
[facility]  - [place] at [facility]   PATIENT NAME: Katelyn Baker    MR#:  [id number]  DATE OF BIRTH:  21-Nov-1945  SUBJECTIVE:   Patient is under comfort care.  Husband in the room. Appears comfortable. REVIEW OF SYSTEMS:   Review of Systems  Unable to perform ROS: Other  pt under comfort care  DRUG ALLERGIES:   Allergies  Allergen Reactions  . Bactrim [Sulfamethoxazole-Trimethoprim] Rash    VITALS:  Blood pressure 101/67, pulse 93, temperature 98.3 F (36.8 C), resp. rate 17, height 5\' 3"  (1.6 m), weight 63.5 kg, SpO2 96 %.  PHYSICAL EXAMINATION:   Physical Exam  GENERAL:  75 y.o.-year-old patient lying in the bed with no acute distress.  CARDIOVASCULAR: S1, S2 normal.  NEUROLOGIC: unresponsive   LABORATORY PANEL:  CBC Recent Labs  Lab 06/20/20 2334  WBC 17.8*  HGB 13.1  HCT 39.1  PLT 169    Chemistries  Recent Labs  Lab 06/20/20 2334  NA 139  K 3.8  CL 106  CO2 24  GLUCOSE 169*  BUN 20  CREATININE 0.86  CALCIUM 8.9  AST 23  ALT 19  ALKPHOS 49  BILITOT 0.9   Cardiac Enzymes No results for input(s): TROPONINI in the last 168 hours. RADIOLOGY:  No results found. ASSESSMENT AND PLAN:  RUMOR SUN is a 75 y.o. female with medical history significant for advanced dementia, hypothyroidism and GERD who was brought into the ER from [facility] memory care unit for evaluation of rectal bleeding. CT done in the emergency room showed  thickened rectosigmoid colon with pneumatosis and extraluminal gas and free fluid largely confined within the planes of the mesocolon and retroperitoneum. Concerning for perforation and possible bowel wall necrosis  Perforated bowel with rectal bleeding Severe Sepsis POA Patient was sent to the emergency room for evaluation of rectal bleeding and imaging showed findings concerning for perforation and bowel wall necrosis. Surgery was consulted in the ER and conversation with the family, the plan is  for patient to be placed on comfort measures with no further aggressive intervention. -- Patient presented with tachycardia elevated white count of 17,000 lactic acid more than 2.3 along with perforation of the bowel. Patient is not being treated since she is comfort care since admission. -- Patient was started on IV fluids and surgery had long discussion with family in the ER and decided towards comfort care. --IV morphine as needed for pain control --Haldol/Ativan for agitation --Family is at the bedside and in agreement with transitioning patient to hospice facility when bed available.   Continue comfort care measures  Family communication : husband at bedside CODE STATUS: DNR DVT Prophylaxis : comfort care Level of care: Med-Surg Status is: Inpatient  Remains inpatient appropriate because:waiting for hospice bed to open up   Dispo: The patient is from: [facility] assisted living              Anticipated d/c is to: hospice facility once bed available      TOTAL TIME TAKING CARE OF THIS PATIENT: 15 minutes.  >50% time spent on counselling and coordination of care  Note: This dictation was prepared with Dragon dictation along with smaller phrase technology. Any transcriptional errors that result from this process are unintentional.  [facility] M.D    [facility]   CC: Primary care physician; Enedina Finner, PAPatient ID: Katelyn Baker, female   DOB: Aug 19, 1945, 75 y.o.   MRN: [id number]

## 2020-06-27 DIAGNOSIS — K631 Perforation of intestine (nontraumatic): Secondary | ICD-10-CM | POA: Diagnosis not present

## 2020-06-27 NOTE — Care Management Important Message (Signed)
Important Message  Patient Details  Name: Katelyn Baker MRN: [id number] Date of Birth: 1945/10/22   Medicare Important Message Given:  Other (see comment)   On comfort care measures with plan to discharge to [facility]osp[facility] once bed available.  Medicare IM withheld at this time out of respect for patient and family.     Johnell Comings 06/27/2020, 8:24 AM

## 2020-06-27 NOTE — Progress Notes (Signed)
[facility] Room 218 [facility]) Hospital Liaison RN note:  Unfortunately, [facility]osp[facility] is not able to offer a room today. Hospital care team is aware. Spoke with daughter, French Ana over the phone to provide update. If a bed becomes available over the weekend, the Hospice Home will reach out to the weekend TOC.  Please call with any hospice related questions or concerns.  Cyndra Numbers, RN [facility] Liaison  [phone]

## 2020-06-27 NOTE — Progress Notes (Signed)
[facility]  - [place] at [facility]   PATIENT NAME: Katelyn Baker    MR#:  [id number]  DATE OF BIRTH:  September 26, 1945  SUBJECTIVE:   Patient is under comfort care.  Husband and dter in the room. Appears comfortable. REVIEW OF SYSTEMS:   Review of Systems  Unable to perform ROS: Other  pt under comfort care  DRUG ALLERGIES:   Allergies  Allergen Reactions  . Bactrim [Sulfamethoxazole-Trimethoprim] Rash    VITALS:  Blood pressure 101/67, pulse 93, temperature 98.3 F (36.8 C), resp. rate 17, height 5\' 3"  (1.6 m), weight 63.5 kg, SpO2 96 %.  PHYSICAL EXAMINATION:   Physical Exam  GENERAL:  75 y.o.-year-old patient lying in the bed with no acute distress.  CARDIOVASCULAR: S1, S2 normal.  NEUROLOGIC: unresponsive   LABORATORY PANEL:  CBC Recent Labs  Lab 06/20/20 2334  WBC 17.8*  HGB 13.1  HCT 39.1  PLT 169    Chemistries  Recent Labs  Lab 06/20/20 2334  NA 139  K 3.8  CL 106  CO2 24  GLUCOSE 169*  BUN 20  CREATININE 0.86  CALCIUM 8.9  AST 23  ALT 19  ALKPHOS 49  BILITOT 0.9   Cardiac Enzymes No results for input(s): TROPONINI in the last 168 hours. RADIOLOGY:  No results found. ASSESSMENT AND PLAN:  Katelyn Baker is a 75 y.o. female with medical history significant for advanced dementia, hypothyroidism and GERD who was brought into the ER from [facility] memory care unit for evaluation of rectal bleeding. CT done in the emergency room showed  thickened rectosigmoid colon with pneumatosis and extraluminal gas and free fluid largely confined within the planes of the mesocolon and retroperitoneum. Concerning for perforation and possible bowel wall necrosis  Perforated bowel with rectal bleeding Severe Sepsis POA -- Patient presented with tachycardia elevated white count of 17,000 lactic acid more than 2.3 along with perforation of the bowel. Patient is not being treated since she is comfort care since admission. -- Patient was  started on IV fluids and surgery had long discussion with family in the ER and decided towards comfort care. --IV morphine as needed for pain control --Haldol/Ativan for agitation --Family is at the bedside and in agreement with transitioning patient to hospice facility when bed available.   Continue comfort care measures  Family communication : husband and dter at bedside CODE STATUS: DNR DVT Prophylaxis : comfort care Level of care: Med-Surg Status is: Inpatient  Remains inpatient appropriate because:waiting for hospice bed to open up   Dispo: The patient is from: [facility] assisted living              Anticipated d/c is to: hospice facility once bed available      TOTAL TIME TAKING CARE OF THIS PATIENT: 15 minutes.  >50% time spent on counselling and coordination of care  Note: This dictation was prepared with Dragon dictation along with smaller phrase technology. Any transcriptional errors that result from this process are unintentional.  [facility] M.D    [facility]   CC: Primary care physician; Katelyn Baker, PAPatient ID: Katelyn Baker, female   DOB: Sep 29, 1945, 75 y.o.   MRN: [id number]

## 2020-06-28 DIAGNOSIS — K631 Perforation of intestine (nontraumatic): Secondary | ICD-10-CM | POA: Diagnosis not present

## 2020-06-28 NOTE — Progress Notes (Signed)
[facility]  - [place] at [facility]   PATIENT NAME: Katelyn Baker    MR#:  [id number]  DATE OF BIRTH:  Nov 19, 1945  SUBJECTIVE:   Patient is under comfort care.  dter in the room. Appears comfortable. REVIEW OF SYSTEMS:   Review of Systems  Unable to perform ROS: Other  pt under comfort care  DRUG ALLERGIES:   Allergies  Allergen Reactions  . Bactrim [Sulfamethoxazole-Trimethoprim] Rash    VITALS:  Blood pressure 101/67, pulse 93, temperature 98.3 F (36.8 C), resp. rate 17, height 5\' 3"  (1.6 m), weight 63.5 kg, SpO2 96 %.  PHYSICAL EXAMINATION:   Physical Exam  GENERAL:  75 y.o.-year-old patient lying in the bed with no acute distress.  CARDIOVASCULAR: S1, S2 normal.  NEUROLOGIC: unresponsive   LABORATORY PANEL:  CBC No results for input(s): WBC, HGB, HCT, PLT in the last 168 hours.  Chemistries  No results for input(s): NA, K, CL, CO2, GLUCOSE, BUN, CREATININE, CALCIUM, MG, AST, ALT, ALKPHOS, BILITOT in the last 168 hours.  Invalid input(s): GFRCGP Cardiac Enzymes No results for input(s): TROPONINI in the last 168 hours. RADIOLOGY:  No results found. ASSESSMENT AND PLAN:  Katelyn Baker is a 75 y.o. female with medical history significant for advanced dementia, hypothyroidism and GERD who was brought into the ER from [facility] memory care unit for evaluation of rectal bleeding. CT done in the emergency room showed  thickened rectosigmoid colon with pneumatosis and extraluminal gas and free fluid largely confined within the planes of the mesocolon and retroperitoneum. Concerning for perforation and possible bowel wall necrosis  Perforated bowel with rectal bleeding Severe Sepsis POA --IV morphine as needed for pain control --Haldol/Ativan for agitation  Continue comfort care measures  Family communication dter at bedside CODE STATUS: DNR DVT Prophylaxis : comfort care Level of care: Med-Surg Status is: Inpatient  Remains inpatient  appropriate because:waiting for hospice bed to open up   Dispo: The patient is from: [facility] assisted living              Anticipated d/c is to: hospice facility once bed available      TOTAL TIME TAKING CARE OF THIS PATIENT: 15 minutes.  >50% time spent on counselling and coordination of care  Note: This dictation was prepared with Dragon dictation along with smaller phrase technology. Any transcriptional errors that result from this process are unintentional.  [facility] M.D    [facility]   CC: Primary care physician; Katelyn Baker, PAPatient ID: Katelyn Baker, female   DOB: [birth year], 75 y.o.   MRN: [id number]

## 2020-06-29 DIAGNOSIS — K631 Perforation of intestine (nontraumatic): Secondary | ICD-10-CM | POA: Diagnosis not present

## 2020-06-29 NOTE — TOC Progression Note (Addendum)
Transition of Care [facility]) - Progression Note    Patient Details  Name: PUANANI GENE MRN: [id number] Date of Birth: December 26, 1945  [facility] Care [facility]) CM/SW Contact  Liliana Cline, LCSW Phone Number: 06/29/2020, 12:25 PM  Clinical Narrative:   Called [facility] Admissions line, left VM requesting a return call regarding status on hospice home waitlist for this patient.  3:30-  Call to [facility] main number. Spoke to [facility]. No beds today. Patient is next on waitlist so may have a bed tomorrow.       Expected Discharge Plan and Services                                                 Social Determinants of Health (SDOH) Interventions    Readmission Risk Interventions No flowsheet data found.

## 2020-06-29 NOTE — Progress Notes (Signed)
[facility]  - [place] at [facility]   PATIENT NAME: Katelyn Baker    MR#:  [id number]  DATE OF BIRTH:  02/19/1946  SUBJECTIVE:   Patient is under comfort care.  dter in the room. Appears comfortable. REVIEW OF SYSTEMS:   Review of Systems  Unable to perform ROS: Other  pt under comfort care  DRUG ALLERGIES:   Allergies  Allergen Reactions  . Bactrim [Sulfamethoxazole-Trimethoprim] Rash    VITALS:  Blood pressure 101/67, pulse 93, temperature 98.3 F (36.8 C), resp. rate 17, height 5' 3" (1.6 m), weight 63.5 kg, SpO2 96 %.  PHYSICAL EXAMINATION:   Physical Exam  GENERAL:  75 y.o.-year-old patient lying in the bed with no acute distress.  CARDIOVASCULAR: S1, S2 normal.  NEUROLOGIC: unresponsive   LABORATORY PANEL:  CBC No results for input(s): WBC, HGB, HCT, PLT in the last 168 hours.  Chemistries  No results for input(s): NA, K, CL, CO2, GLUCOSE, BUN, CREATININE, CALCIUM, MG, AST, ALT, ALKPHOS, BILITOT in the last 168 hours.  Invalid input(s): GFRCGP Cardiac Enzymes No results for input(s): TROPONINI in the last 168 hours. RADIOLOGY:  No results found. ASSESSMENT AND PLAN:  Katelyn Baker is a 75 y.o. female with medical history significant for advanced dementia, hypothyroidism and GERD who was brought into the ER from [place] memory care unit for evaluation of rectal bleeding. CT done in the emergency room showed  thickened rectosigmoid colon with pneumatosis and extraluminal gas and free fluid largely confined within the planes of the mesocolon and retroperitoneum. Concerning for perforation and possible bowel wall necrosis  Perforated bowel with rectal bleeding Severe Sepsis POA --IV morphine as needed for pain control --Haldol/Ativan for agitation  Continue comfort care measures  Family communication dter at bedside CODE STATUS: DNR DVT Prophylaxis : comfort care Level of care: Med-Surg Status is: Inpatient  Remains inpatient  appropriate because:waiting for hospice bed to open up   Dispo: The patient is from: [facility] assisted living              Anticipated d/c is to: hospice facility once bed available      TOTAL TIME TAKING CARE OF THIS PATIENT: 15 minutes.  >50% time spent on counselling and coordination of care  Note: This dictation was prepared with Dragon dictation along with smaller phrase technology. Any transcriptional errors that result from this process are unintentional.  Katelyn Baker M.D    [facility]   CC: Primary care physician; Curl, David, PAPatient ID: Katelyn Baker, female   DOB: 05/09/1945, 75 y.o.   MRN: [id number]  

## 2020-06-30 DIAGNOSIS — K631 Perforation of intestine (nontraumatic): Secondary | ICD-10-CM | POA: Diagnosis not present

## 2020-06-30 MED ORDER — POLYVINYL ALCOHOL 1.4 % OP SOLN: 1.0000 [drp] | Freq: Four times a day (QID) | OPHTHALMIC | 0 refills | Status: DC | PRN

## 2020-06-30 MED ORDER — MORPHINE SULFATE (PF) 2 MG/ML IV SOLN: 2.0000 mg | INTRAVENOUS | 0 refills | Status: DC | PRN

## 2020-06-30 MED ORDER — MORPHINE SULFATE (CONCENTRATE) 10 MG/0.5ML PO SOLN: 10.0000 mg | ORAL | Status: DC | PRN

## 2020-06-30 NOTE — Plan of Care (Signed)
  Problem: Education: Goal: Knowledge of General Education information will improve Description: Including pain rating scale, medication(s)/side effects and non-pharmacologic comfort measures 06/30/2020 1736 by Carrington Clamp, RN Outcome: Adequate for Discharge 06/30/2020 1733 by Carrington Clamp, RN Outcome: Progressing   Problem: Coping: Goal: Level of anxiety will decrease 06/30/2020 1736 by Carrington Clamp, RN Outcome: Adequate for Discharge 06/30/2020 1733 by Carrington Clamp, RN Outcome: Progressing   Problem: Elimination: Goal: Will not experience complications related to urinary retention 06/30/2020 1736 by Carrington Clamp, RN Outcome: Adequate for Discharge 06/30/2020 1733 by Carrington Clamp, RN Outcome: Progressing   Problem: Pain Managment: Goal: General experience of comfort will improve 06/30/2020 1736 by Carrington Clamp, RN Outcome: Adequate for Discharge 06/30/2020 1733 by Carrington Clamp, RN Outcome: Progressing   Problem: Safety: Goal: Ability to remain free from injury will improve 06/30/2020 1736 by Carrington Clamp, RN Outcome: Adequate for Discharge 06/30/2020 1733 by Carrington Clamp, RN Outcome: Progressing   Problem: Skin Integrity: Goal: Risk for impaired skin integrity will decrease 06/30/2020 1736 by Carrington Clamp, RN Outcome: Adequate for Discharge 06/30/2020 1733 by Carrington Clamp, RN Outcome: Progressing

## 2020-06-30 NOTE — Progress Notes (Signed)
[facility] Room 218 [facility]) Hospital Liaison RN note:  Hospice Home is able to offer a room today. Hospital care team is aware. Visited with patient and spouse at bedside. Spoke with daughter, French Ana over the phone. French Ana will sign consents at the Hospice Home at 1pm and transport has been arranged for 5pm. I will fax the discharge summary.  Please call with any hospice related questions or concerns.  Thank you for the opportunity to participate in this patient's care.  Cyndra Numbers, RN [facility] Liaison [phone]

## 2020-06-30 NOTE — Progress Notes (Signed)
Pt resting comfortably. Husband at bedside.

## 2020-06-30 NOTE — Care Management Important Message (Signed)
Important Message  Patient Details  Name: Katelyn Baker MRN: [id number] Date of Birth: [birth year]   Medicare Important Message Given:  Other (see comment)  On comfort care measures with plan to discharge to Hospice Home once bed available. Medicare IM withheld at this time out of respect for patient and family.    Johnell Comings 06/30/2020, 8:17 AM

## 2020-06-30 NOTE — TOC Transition Note (Signed)
Transition of Care [facility]) - CM/SW Discharge Note   Patient Details  Name: Katelyn Baker MRN: [id number] Date of Birth: 1945/08/29  Transition of Care [facility]) CM/SW Contact:  Katelyn Griffes, LCSW Phone Number: 06/30/2020, 10:32 AM   Clinical Narrative:     Patient will DC to: [facility] Anticipated DC date: 06/30/20 Family notified: [facility] has informed family Transport by: [facility] scheduled for 5:00 pm  Per MD patient ready for DC to [facility]. RN, patient, patient's family, and facility notified of DC. Discharge Summary sent to facility.DC packet and DNR on chart. Ambulance transport requested for patient through [facility] at 5:00 pm.  CSW signing off.  Katelyn Slim, LCSW    Final next level of care: Hospice Medical Facility Barriers to Discharge: No Barriers Identified   Patient Goals and CMS Choice   CMS Medicare.gov Compare Post Acute Care list provided to:: Patient Represenative (must comment) (family and daughter French Ana) Choice offered to / list presented to : Patient,Adult Children  Discharge Placement              Patient chooses bed at:  [facility]) Patient to be transferred to facility by: [facility] Name of family member notified: [facility] has discussed with family Patient and family notified of of transfer: 06/30/20  Discharge Plan and Services                                     Social Determinants of Health (SDOH) Interventions     Readmission Risk Interventions No flowsheet data found.

## 2020-06-30 NOTE — Discharge Summary (Signed)
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 GERD (gastroesophageal reflux disease)   . Hyperlipidemia   . Thyroid disease     HOSPITAL COURSE:   Katelyn Arrowancy J Edwardsis a 75 y.o.femalewith medical history significant foradvanced dementia, hypothyroidism and GERD who was [facility] for evaluation of rectal bleeding. CT done in the emergency room showed thickened rectosigmoid colon with pneumatosis and extraluminal gas and free fluid largely confined within the planes of the mesocolon and retroperitoneum. Concerning for perforation and possible bowel wall necrosis  Perforated bowel with rectal bleeding Severe Sepsis POA --IV morphine as needed for pain control --Haldol/Ativan for agitation  Continue comfort care measures  Family communication husband at bedside CODE STATUS: DNR DVT Prophylaxis : comfort care Level of care: Med-Surg Status is: Inpatient    Dispo: [facility]  Anticipated d/c is to: hospice facility today  Husband informed of patient leaving today. CONSULTS OBTAINED:    DRUG ALLERGIES:   Allergies  Allergen Reactions  . Bactrim [Sulfamethoxazole-Trimethoprim] Rash    DISCHARGE MEDICATIONS:   Allergies as of 06/30/2020      Reactions   Bactrim  [sulfamethoxazole-trimethoprim] Rash      Medication List    STOP taking these medications   buPROPion 150 MG 24 hr tablet Commonly known as: Wellbutrin XL   CALCIUM 600 PO   levothyroxine 100 MCG tablet Commonly known as: SYNTHROID   levothyroxine 112 MCG tablet Commonly known as: SYNTHROID   lisinopril 10 MG tablet Commonly known as: ZESTRIL   multivitamin tablet   multivitamin with minerals tablet   Namzaric 7-10 MG Cp24 Generic drug: Memantine HCl-Donepezil HCl   QUEtiapine 25 MG tablet Commonly known as: SEROQUEL   QUEtiapine 50 MG tablet Commonly known as: SEROQUEL   traZODone 50 MG tablet Commonly known as: DESYREL     TAKE these medications   LORazepam 0.5 MG tablet Commonly known as: ATIVAN Take 0.5 mg by mouth 2 (two) times daily as needed for sedation.   morphine 2 MG/ML injection Inject 1 mL (2 mg total) into the vein every 2 (two) hours as needed (or dyspnea).   polyvinyl alcohol 1.4 % ophthalmic solution Commonly known as: LIQUIFILM TEARS Place 1 drop into both eyes 4 (four) times daily as needed for dry eyes.       If you experience worsening of your admission symptoms, develop shortness of breath, life threatening emergency, suicidal or homicidal thoughts you must seek medical attention immediately by calling 911 or calling your MD immediately  if symptoms less severe.  You Must read complete instructions/literature along with all the possible adverse reactions/side effects for all the Medicines you take and that have been prescribed to you. Take any new Medicines after you have completely understood and accept all the possible adverse reactions/side effects.   Please note  You were cared for by a hospitalist during your hospital  stay. If you have any questions about your discharge medications or the care you received while you were in the hospital after you are discharged, you can call the unit and asked to speak with the hospitalist on call  if the hospitalist that took care of you is not available. Once you are discharged, your primary care physician will handle any further medical issues. Please note that NO REFILLS for any discharge medications will be authorized once you are discharged, as it is imperative that you return to your primary care physician (or establish a relationship with a primary care physician if you do not have one) for your aftercare needs so that they can reassess your need for medications and monitor your lab values. Today   SUBJECTIVE  Resting comfortably   VITAL SIGNS:  Blood pressure 101/67, pulse 93, temperature 98.3 F (36.8 C), resp. rate 17, height 5\' 3"  (1.6 m), weight 63.5 kg, SpO2 96 %.  I/O:    Intake/Output Summary (Last 24 hours) at 06/30/2020 1010 Last data filed at 06/30/2020 0800 Gross per 24 hour  Intake 0 ml  Output 0 ml  Net 0 ml    PHYSICAL EXAMINATION:  Physical Exam  GENERAL:  75 y.o.-year-old patient lying in the bed with no acute distress.  CARDIOVASCULAR: S1, S2 normal.  NEUROLOGIC: unresponsive DATA REVIEW:   CBC  No results for input(s): WBC, HGB, HCT, PLT in the last 168 hours.  Chemistries  No results for input(s): NA, K, CL, CO2, GLUCOSE, BUN, CREATININE, CALCIUM, MG, AST, ALT, ALKPHOS, BILITOT in the last 168 hours.  Invalid input(s): GFRCGP  Microbiology Results   Recent Results (from the past 240 hour(s))  Resp Panel by RT-PCR (Flu A&B, Covid) Nasopharyngeal Swab     Status: None   Collection Time: 06/20/20 11:41 PM   Specimen: Nasopharyngeal Swab; Nasopharyngeal(NP) swabs in vial transport medium  Result Value Ref Range Status   SARS Coronavirus 2 by RT PCR NEGATIVE NEGATIVE Final    Comment: (NOTE) SARS-CoV-2 target nucleic acids are NOT DETECTED.  The SARS-CoV-2 RNA is generally detectable in upper respiratory specimens during the acute phase of infection. The lowest concentration of SARS-CoV-2 viral copies this assay can detect is 138  copies/mL. A negative result does not preclude SARS-Cov-2 infection and should not be used as the sole basis for treatment or other patient management decisions. A negative result may occur with  improper specimen collection/handling, submission of specimen other than nasopharyngeal swab, presence of viral mutation(s) within the areas targeted by this assay, and inadequate number of viral copies(<138 copies/mL). A negative result must be combined with clinical observations, patient history, and epidemiological information. The expected result is Negative.  Fact Sheet for Patients:  06/22/20  Fact Sheet for Healthcare Providers:  BloggerCourse.com  This test is no t yet approved or cleared by the [place] FDA and  has been authorized for detection and/or diagnosis of SARS-CoV-2 by FDA under an Emergency Use Authorization (EUA). This EUA will remain  in effect (meaning this test can be used) for the duration of the COVID-19 declaration under Section 564(b)(1) of the Act, 21 U.S.C.section 360bbb-3(b)(1), unless the authorization is terminated  or revoked sooner.       Influenza A by PCR NEGATIVE NEGATIVE Final   Influenza B by PCR NEGATIVE NEGATIVE Final    Comment: (NOTE) The Xpert Xpress SARS-CoV-2/FLU/RSV plus assay is intended as an aid in the diagnosis of influenza from Nasopharyngeal swab specimens and should not be used as  a sole basis for treatment. Nasal washings and aspirates are unacceptable for Xpert Xpress SARS-CoV-2/FLU/RSV testing.  Fact Sheet for Patients: BloggerCourse.com  Fact Sheet for Healthcare Providers: SeriousBroker.it  This test is not yet approved or cleared by the [place] FDA and has been authorized for detection and/or diagnosis of SARS-CoV-2 by FDA under an Emergency Use Authorization (EUA). This EUA will remain in effect (meaning  this test can be used) for the duration of the COVID-19 declaration under Section 564(b)(1) of the Act, 21 U.S.C. section 360bbb-3(b)(1), unless the authorization is terminated or revoked.  Performed at [facility], [address] [place], [place] [address]   Blood culture (routine x 2)     Status: None   Collection Time: 06/21/20 12:12 AM   Specimen: BLOOD  Result Value Ref Range Status   Specimen Description BLOOD LEFT ANTECUBITAL  Final   Special Requests   Final    BOTTLES DRAWN AEROBIC AND ANAEROBIC Blood Culture results may not be optimal due to an excessive volume of blood received in culture bottles   Culture   Final    NO GROWTH 5 DAYS Performed at [facility], [address], [place], [place] [address]    Report Status 06/26/2020 FINAL  Final  Blood culture (routine x 2)     Status: None   Collection Time: 06/21/20 12:12 AM   Specimen: BLOOD  Result Value Ref Range Status   Specimen Description BLOOD RIGHT ANTECUBITAL  Final   Special Requests   Final    BOTTLES DRAWN AEROBIC AND ANAEROBIC Blood Culture adequate volume   Culture   Final    NO GROWTH 5 DAYS Performed at [facility], [address] [place], [place] [address]    Report Status 06/26/2020 FINAL  Final  Urine Culture     Status: None   Collection Time: 06/21/20  4:15 AM   Specimen: Urine, Random  Result Value Ref Range Status   Specimen Description   Final    URINE, RANDOM Performed at [facility], [address] [place], [place] [address]    Special Requests   Final    Normal Performed at [facility], [address] [place], [place] [address]    Culture   Final    NO GROWTH Performed at [facility], [address], [place], [place] [address]    Report Status 06/22/2020 FINAL  Final    RADIOLOGY:  No results found.   CODE STATUS:     Code Status Orders  (From admission, onward)         Start     Ordered   06/21/20  0824  Do not attempt resuscitation (DNR)  Continuous       Question Answer Comment  In the event of cardiac or respiratory ARREST Do not call a "code blue"   In the event of cardiac or respiratory ARREST Do not perform Intubation, CPR, defibrillation or ACLS   In the event of cardiac or respiratory ARREST Use medication by any route, position, wound care, and other measures to relive pain and suffering. May use oxygen, suction and manual treatment of airway obstruction as needed for comfort.      06/21/20 0824        Code Status History    Date Active Date Inactive Code Status Order ID Comments User Context   06/21/2020 [id number] 06/21/2020 0824 DNR [id number]  Loleta Rose, MD ED   Advance Care Planning Activity  TOTAL TIME TAKING CARE OF THIS PATIENT: *40* minutes.    Enedina Finner M.D  [facility]    CC: Primary care physician; [name], [place]

## 2020-06-30 NOTE — TOC Progression Note (Deleted)
Transition of Care [facility]) - Progression Note    Patient Details  Name: Katelyn Baker MRN: [id number] Date of Birth: August 11, 1945  Transition [facility] Care [facility]) CM/SW Contact  Gildardo Griffes, [place] Phone Number: 06/30/2020, 9:17 AM  Clinical Narrative:     Spoke with Sue Lush at [facility], they are able to accept patient today. Insurance auth number sent to them, updated in system change of facility to [facility].   Patient ready to dc to [facility] on this day. Pending dc summary.        Expected Discharge Plan and Services                                                 Social Determinants of Health (SDOH) Interventions    Readmission Risk Interventions No flowsheet data found.

## 2020-06-30 NOTE — Plan of Care (Signed)

## 2020-06-30 NOTE — TOC Progression Note (Signed)
Transition of Care [facility]) - Progression Note    Patient Details  Name: Katelyn Baker MRN: [id number] Date of Birth: 04-14-1945  Transition of Care [facility]) CM/SW Contact  Gildardo Griffes, [place] Phone Number: 06/30/2020, 9:19 AM  Clinical Narrative:     CSW reached out to [facility] hospice for update on bed availability today, pending response at this time.        Expected Discharge Plan and Services                                                 Social Determinants of Health (SDOH) Interventions    Readmission Risk Interventions No flowsheet data found.

## 2020-07-23 DEATH — deceased

## 2022-06-23 IMAGING — CT CT MAXILLOFACIAL W/O CM
3 series · 14 of 47 positions shown, 16 images · non-contrast
Comparison: None

CLINICAL DATA: Status post fall abrasion to left upper cheek.
Dementia.

EXAM:
CT HEAD WITHOUT CONTRAST
CT MAXILLOFACIAL WITHOUT CONTRAST
TECHNIQUE: Multidetector CT imaging of the head and maxillofacial structures
were performed using the standard protocol without intravenous
contrast. Multiplanar CT image reconstructions of the maxillofacial
structures were also generated.

[Series 3: max soft · axial · 0.33mm/px · z∈[-249,-101]mm · 8 of 86 slices shown, 10 images]
[im 6/86  brain]
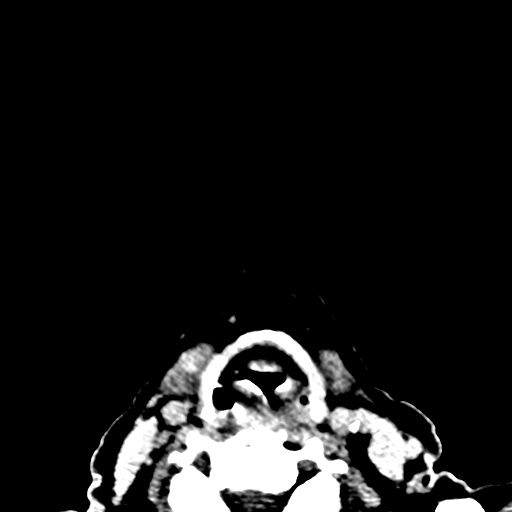
[im 6/86  bone]
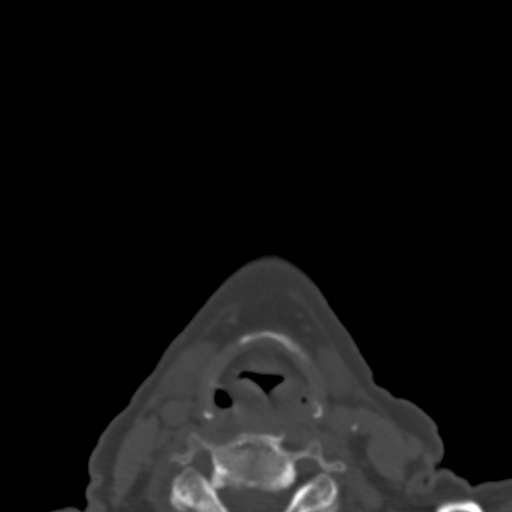
[im 18/86  bone]
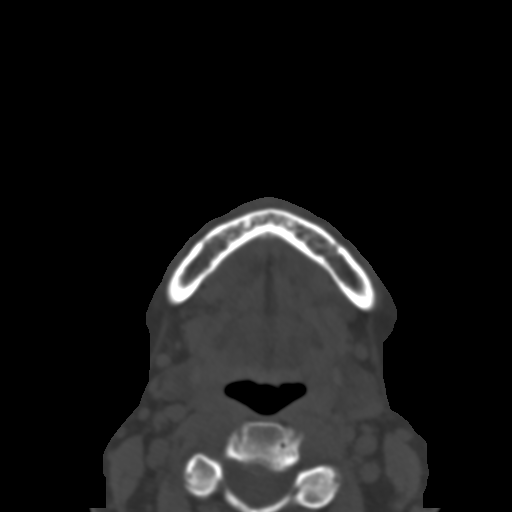
[im 27/86  bone]
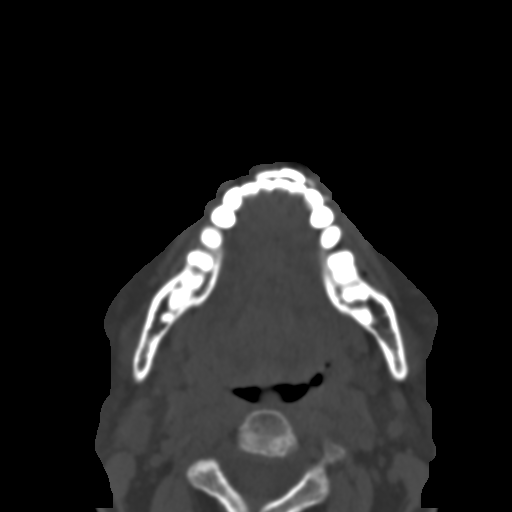
[im 39/86  bone]
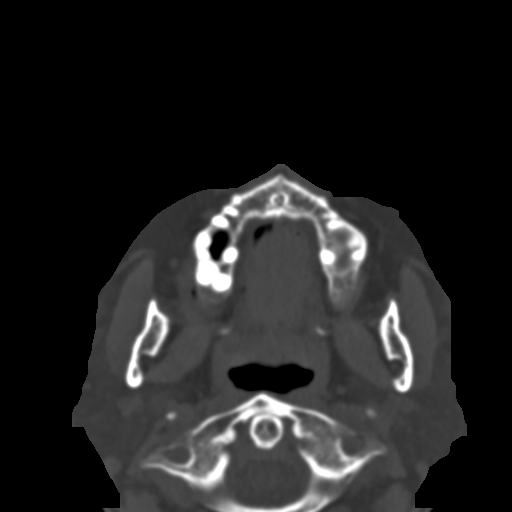
[im 47/86  brain]
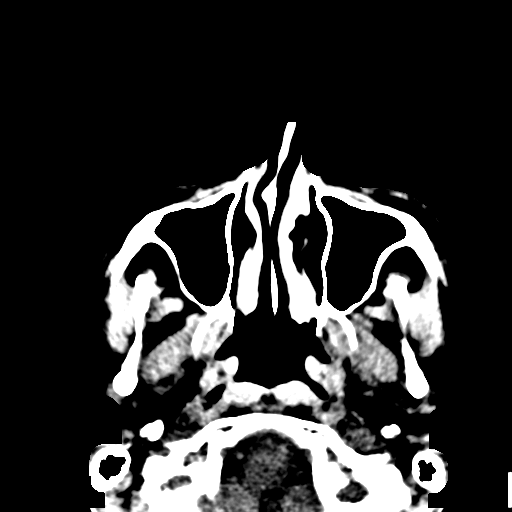
[im 47/86  bone]
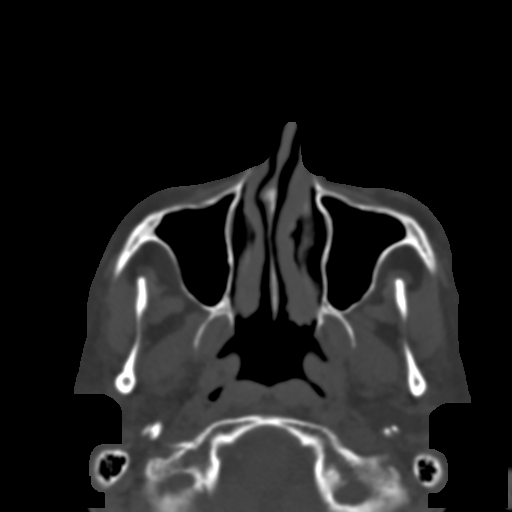
[im 59/86  bone]
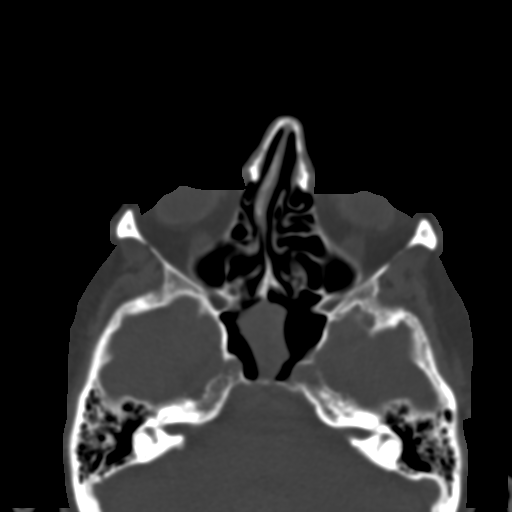
[im 68/86  bone]
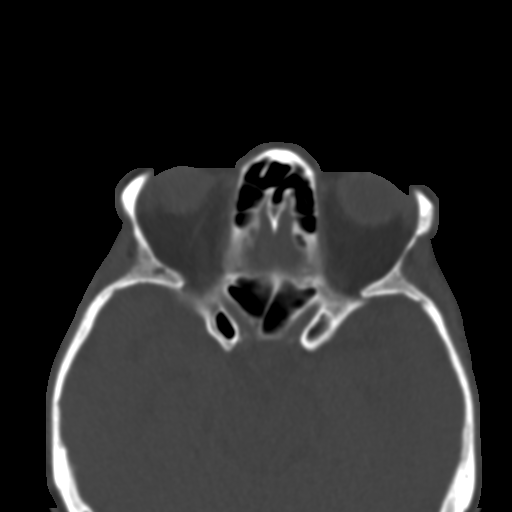
[im 80/86  bone]
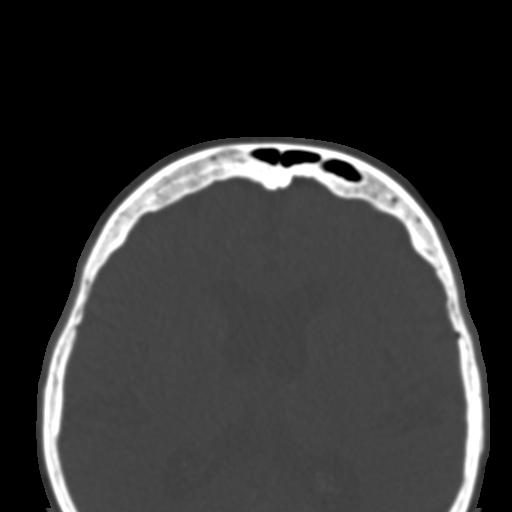

[Series 4: coronal soft · coronal · 0.30mm/px · 3 of 65 slices shown]
[im 22/65  bone]
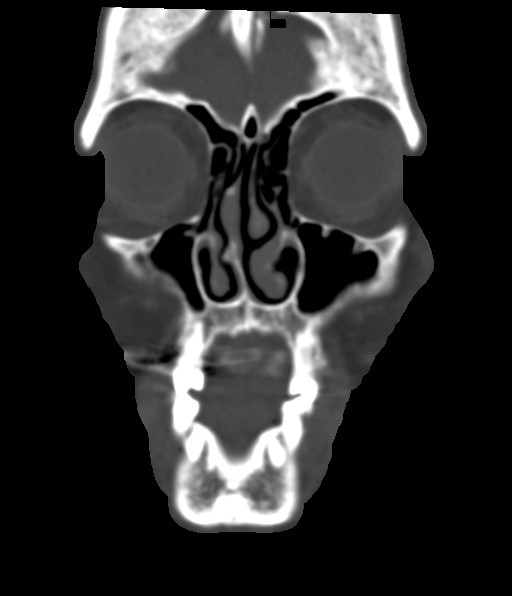
[im 29/65  bone]
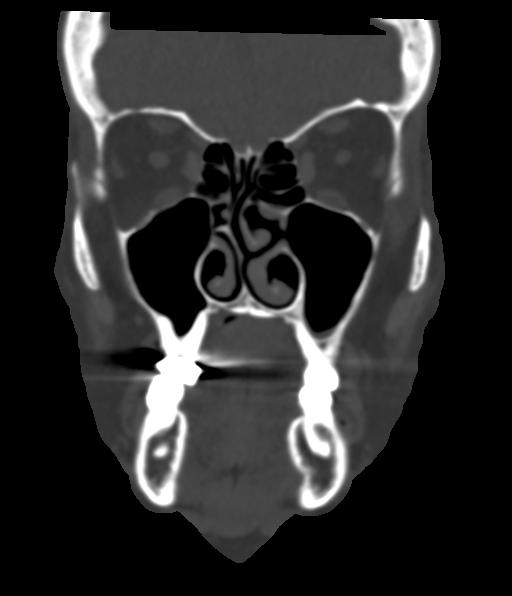
[im 36/65  bone]
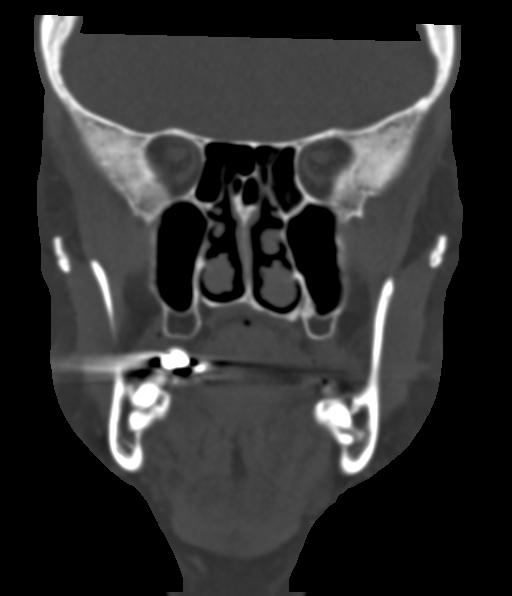

[Series 6: sagittal soft · sagittal · 0.25mm/px · 3 of 77 slices shown]
[im 26/77  bone]
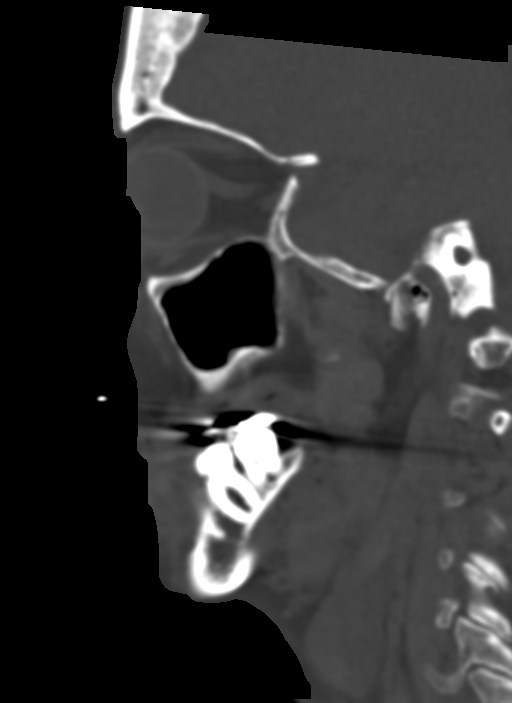
[im 39/77  bone]
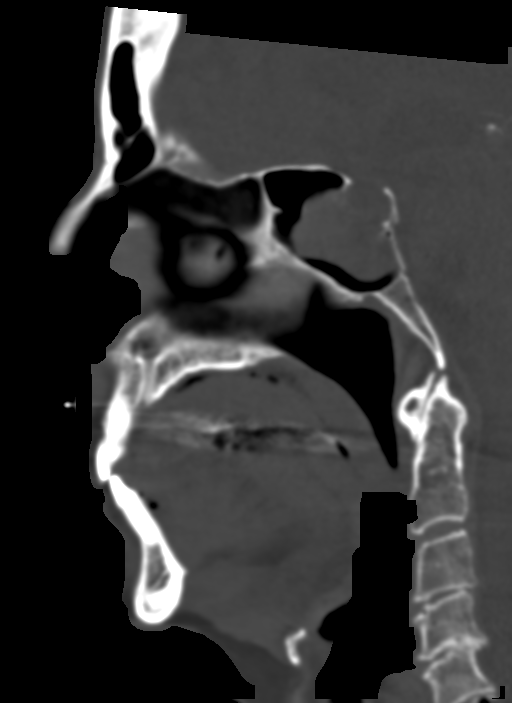
[im 51/77  bone]
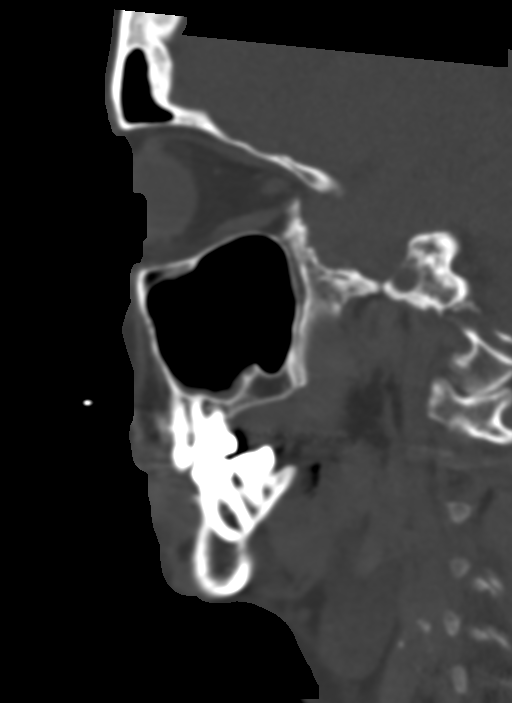

[14 of 47 positions shown; findings below may reference images not displayed]

FINDINGS: CT HEAD FINDINGS

Brain:

Cerebral ventricle sizes are concordant with the degree of cerebral
volume loss.

Patchy and confluent areas of decreased attenuation are noted
throughout the deep and periventricular white matter of the cerebral
hemispheres bilaterally, compatible with chronic microvascular
ischemic disease.

Erosion of the pituitary sella with a likely pituitary mass
extending into the sphenoid sinuses. The mass measures approximately
1.6 x 2.5 x 2.1 cm and appears to be heterogeneous with associated
calcifications. No evidence of large-territorial acute infarction.
No parenchymal hemorrhage. No extra-axial collection.

No mass effect or midline shift. No hydrocephalus. Basilar cisterns
are patent.

Vascular: No hyperdense vessel. Atherosclerotic calcifications are
present within the cavernous internal carotid arteries.

Skull: No acute fracture or focal lesion.

Other: None.

CT MAXILLOFACIAL FINDINGS

Osseous: No acute displaced fracture.

Sinuses/Orbits: Other than a mass (described above) extending
midline along the sphenoid sinuses, the paranasal sinuses and
mastoid air cells are clear. The orbits are unremarkable.

Soft tissues: Mild left maxillary subcutaneus soft tissue edema.
IMPRESSION: 1. Erosion of the pituitary sella with a likely pituitary mass
extending into the sphenoid sinuses. The mass measures approximately
1.6 x 2.5 x 2.1 cm and appears to be heterogeneous with associated
calcifications. Recommend MRI pituitary gland for further
evaluation.
2. Otherwise no acute intracranial abnormality.
3. No acute displaced facial fracture.

These results were called by telephone at the time of interpretation
on 01/19/2020 at [DATE] to provider PA JARNAIL SCHMIDT , who
verbally acknowledged these results.

## 2022-06-23 IMAGING — CR DG SHOULDER 2+V*R*
3 series · 3 of 3 positions shown · non-contrast
Comparison: Chest radiograph dated 01/31/2018.

CLINICAL DATA: Pain after a fall.

EXAM:
RIGHT SHOULDER - 2+ VIEW

[shoulder grashey]
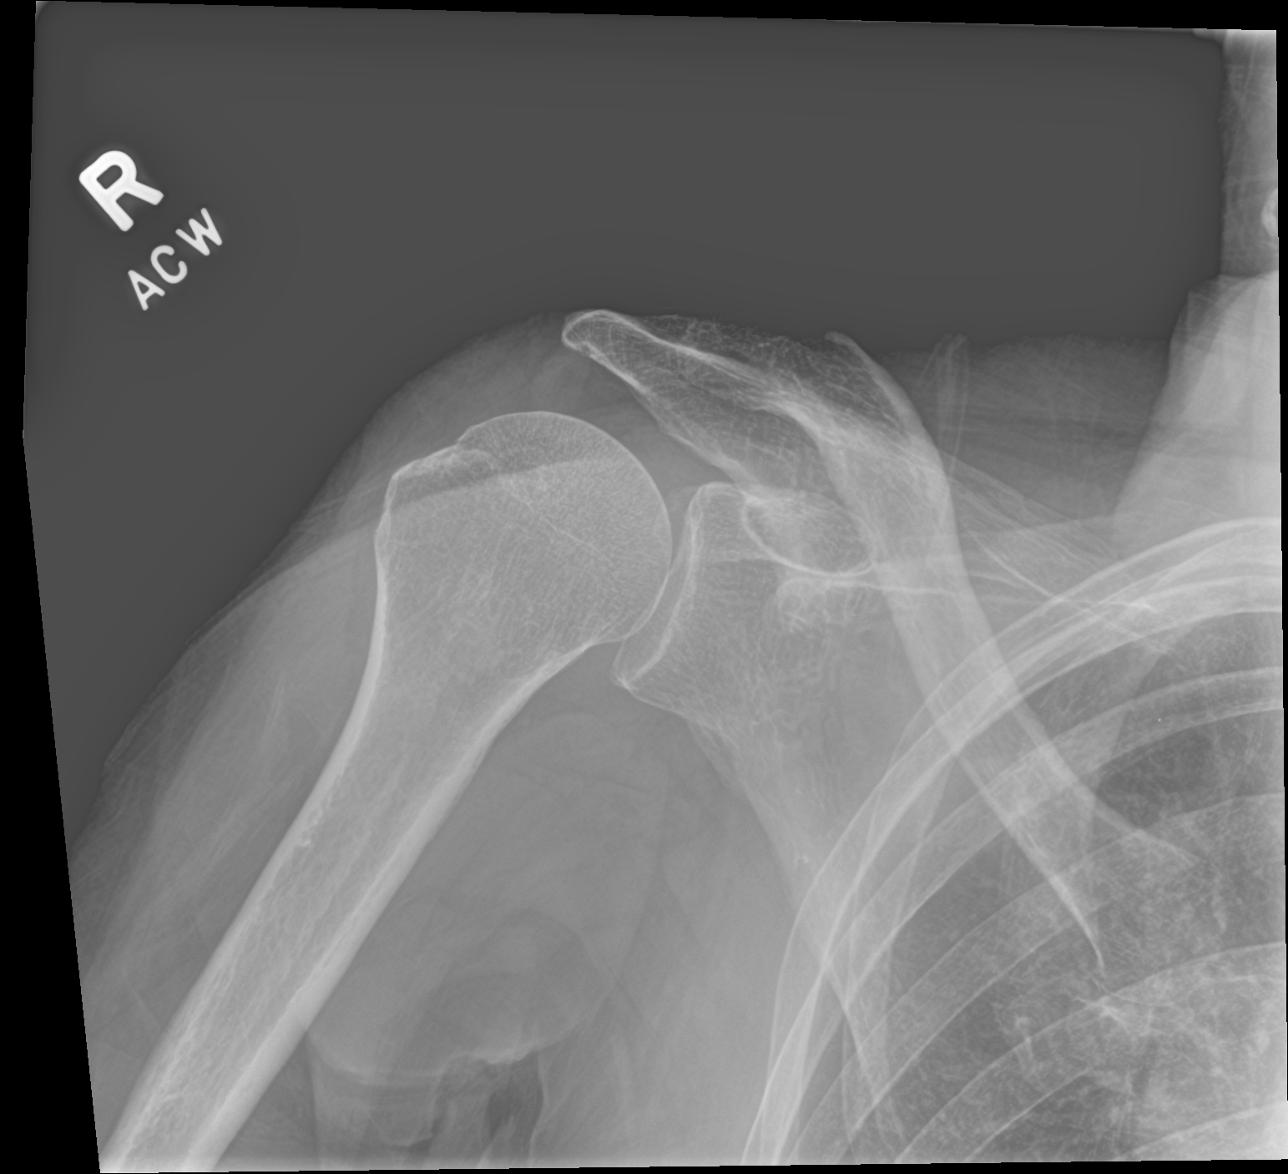

[shoulder y view]
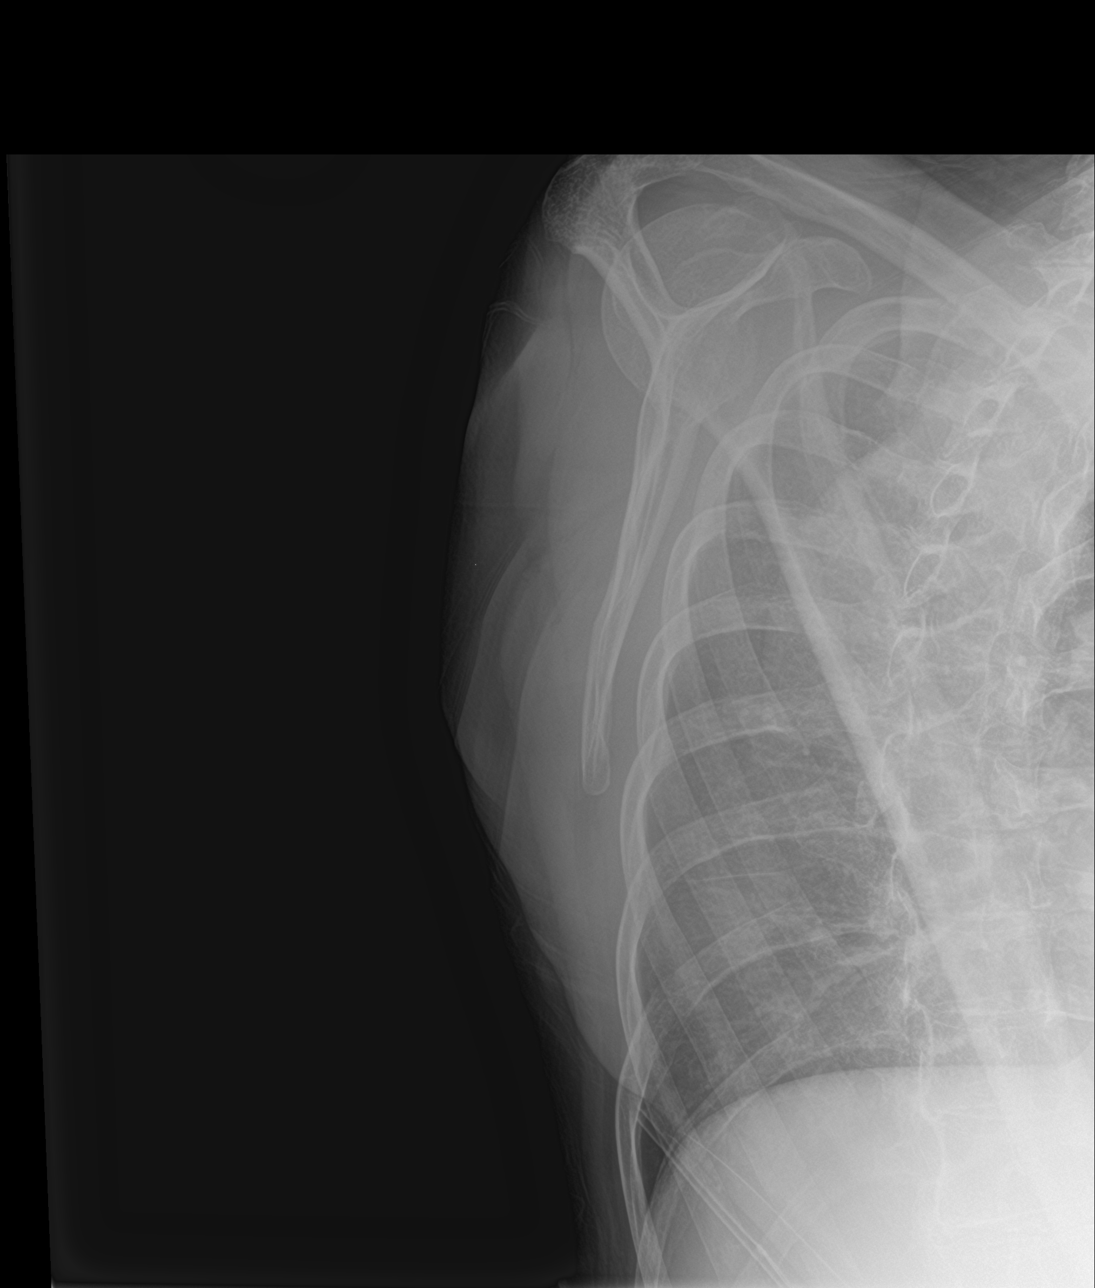

[shoulder ap neutral]
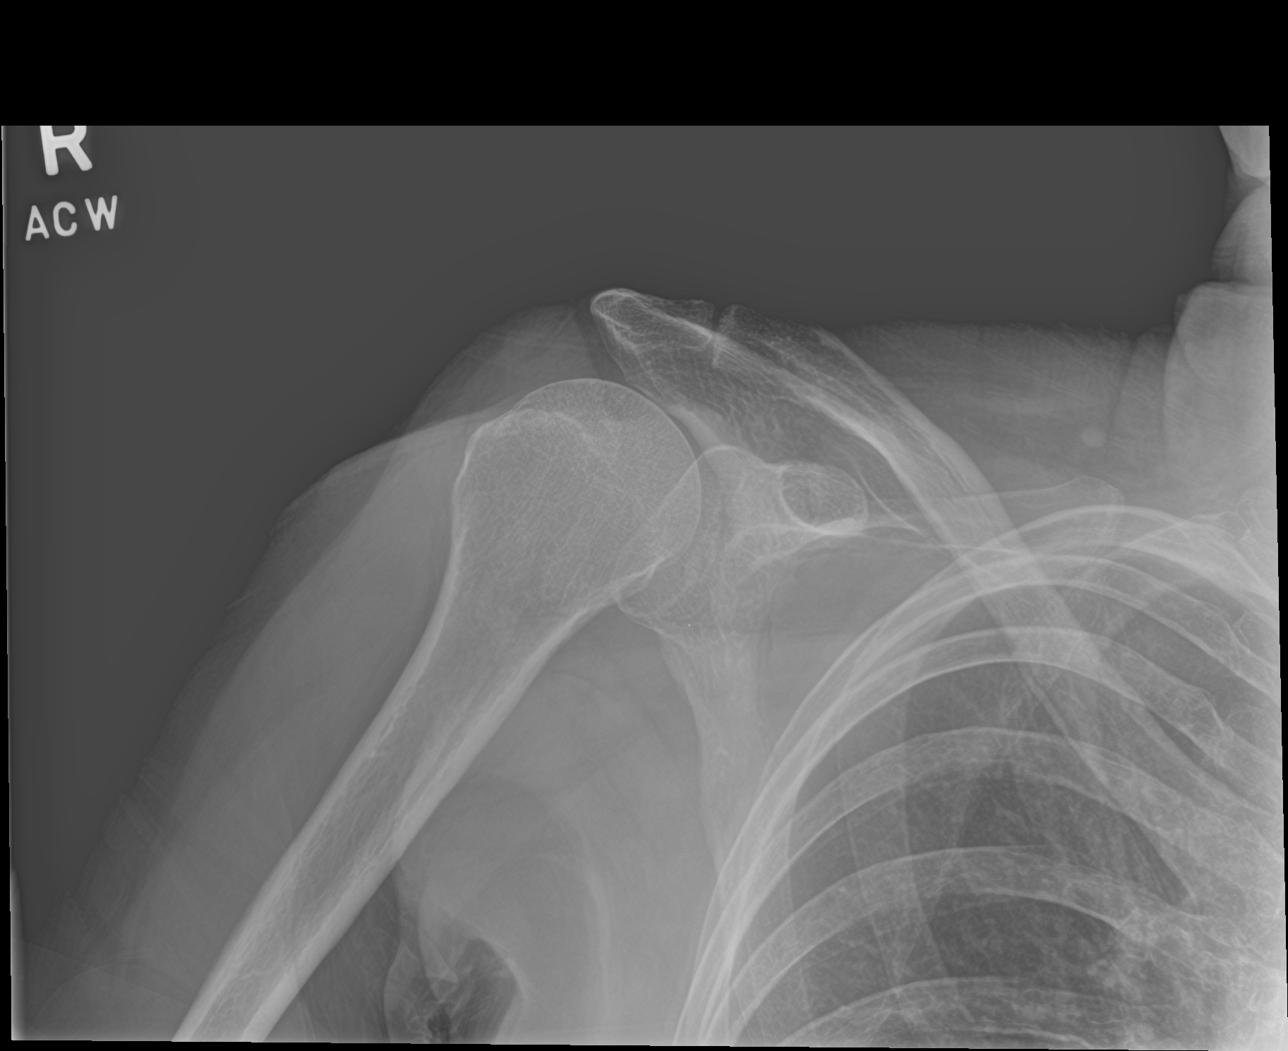

[3 of 3 positions shown; findings below may reference images not displayed]

FINDINGS: There is no evidence of fracture or dislocation. There is no
evidence of arthropathy or other focal bone abnormality. Soft
tissues are unremarkable.
IMPRESSION: Negative.
